# Patient Record
Sex: Female | Born: 1995 | Race: Black or African American | Hispanic: No | Marital: Single | Smoking: Never smoker
Health system: Southern US, Community
[De-identification: ages and names within clinical notes are randomized; demographics above are authoritative.]

## PROBLEM LIST (undated history)

## (undated) DIAGNOSIS — D821 Di George's syndrome: Secondary | ICD-10-CM

## (undated) HISTORY — PX: TONSILLECTOMY: SUR1361

---
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
## 2015-07-27 ENCOUNTER — Ambulatory Visit (INDEPENDENT_AMBULATORY_CARE_PROVIDER_SITE_OTHER): Payer: BC Managed Care – PPO | Admitting: Urgent Care

## 2015-07-27 VITALS — BP 112/66 | HR 88 | Temp 98.4°F | Resp 16 | Ht 65.0 in | Wt 208.0 lb

## 2015-07-27 DIAGNOSIS — H6123 Impacted cerumen, bilateral: Secondary | ICD-10-CM | POA: Diagnosis not present

## 2015-07-27 DIAGNOSIS — J029 Acute pharyngitis, unspecified: Secondary | ICD-10-CM | POA: Diagnosis not present

## 2015-07-27 MED ORDER — AMOXICILLIN 875 MG PO TABS: 875.0000 mg | ORAL_TABLET | Freq: Two times a day (BID) | ORAL | Status: DC

## 2015-07-27 NOTE — Patient Instructions (Signed)
Pharyngitis °Pharyngitis is redness, pain, and swelling (inflammation) of your pharynx.  °CAUSES  °Pharyngitis is usually caused by infection. Most of the time, these infections are from viruses (viral) and are part of a cold. However, sometimes pharyngitis is caused by bacteria (bacterial). Pharyngitis can also be caused by allergies. Viral pharyngitis may be spread from person to person by coughing, sneezing, and personal items or utensils (cups, forks, spoons, toothbrushes). Bacterial pharyngitis may be spread from person to person by more intimate contact, such as kissing.  °SIGNS AND SYMPTOMS  °Symptoms of pharyngitis include:   °· Sore throat.   °· Tiredness (fatigue).   °· Low-grade fever.   °· Headache. °· Joint pain and muscle aches. °· Skin rashes. °· Swollen lymph nodes. °· Plaque-like film on throat or tonsils (often seen with bacterial pharyngitis). °DIAGNOSIS  °Your health care provider will ask you questions about your illness and your symptoms. Your medical history, along with a physical exam, is often all that is needed to diagnose pharyngitis. Sometimes, a rapid strep test is done. Other lab tests may also be done, depending on the suspected cause.  °TREATMENT  °Viral pharyngitis will usually get better in 3-4 days without the use of medicine. Bacterial pharyngitis is treated with medicines that kill germs (antibiotics).  °HOME CARE INSTRUCTIONS  °· Drink enough water and fluids to keep your urine clear or pale yellow.   °· Only take over-the-counter or prescription medicines as directed by your health care provider:   °· If you are prescribed antibiotics, make sure you finish them even if you start to feel better.   °· Do not take aspirin.   °· Get lots of rest.   °· Gargle with 8 oz of salt water (½ tsp of salt per 1 qt of water) as often as every 1-2 hours to soothe your throat.   °· Throat lozenges (if you are not at risk for choking) or sprays may be used to soothe your throat. °SEEK MEDICAL  CARE IF:  °· You have large, tender lumps in your neck. °· You have a rash. °· You cough up green, yellow-brown, or bloody spit. °SEEK IMMEDIATE MEDICAL CARE IF:  °· Your neck becomes stiff. °· You drool or are unable to swallow liquids. °· You vomit or are unable to keep medicines or liquids down. °· You have severe pain that does not go away with the use of recommended medicines. °· You have trouble breathing (not caused by a stuffy nose). °MAKE SURE YOU:  °· Understand these instructions. °· Will watch your condition. °· Will get help right away if you are not doing well or get worse. °  °This information is not intended to replace advice given to you by your health care provider. Make sure you discuss any questions you have with your health care provider. °  °Document Released: 09/27/2005 Document Revised: 07/18/2013 Document Reviewed: 06/04/2013 °Elsevier Interactive Patient Education ©2016 Elsevier [employer] °Cerumen Impaction °The structures of the external ear canal secrete a waxy substance known as cerumen. Excess cerumen can build up in the ear canal, causing a condition known as cerumen impaction. Cerumen impaction can cause ear pain and disrupt the function of the ear. °The rate of cerumen production differs for each individual. In certain individuals, the configuration of the ear canal may decrease his or her ability to naturally remove cerumen. °CAUSES °Cerumen impaction is caused by excessive cerumen production or buildup. °RISK FACTORS °· Frequent use of swabs to clean ears. °· Having narrow ear canals. °· Having eczema. °· Being dehydrated. °  SIGNS AND SYMPTOMS °· Diminished hearing. °· Ear drainage. °· Ear pain. °· Ear itch. °TREATMENT °Treatment may involve: °· Over-the-counter or prescription ear drops to soften the cerumen. °· Removal of cerumen by a health care provider. This may be done with: °¨ Irrigation with warm water. This is the most common method of removal. °¨ Ear curettes and other  instruments. °¨ Surgery. This may be done in severe cases. °HOME CARE INSTRUCTIONS °· Take medicines only as directed by your health care provider. °· Do not insert objects into the ear with the intent of cleaning the ear. °PREVENTION °· Do not insert objects into the ear, even with the intent of cleaning the ear. Removing cerumen as a part of normal hygiene is not necessary, and the use of swabs in the ear canal is not recommended. °· Drink enough water to keep your urine clear or pale yellow. °· Control your eczema if you have it. °SEEK MEDICAL CARE IF: °· You develop ear pain. °· You develop bleeding from the ear. °· The cerumen does not clear after you use ear drops as directed. °  °This information is not intended to replace advice given to you by your health care provider. Make sure you discuss any questions you have with your health care provider. °  °Document Released: 11/04/2004 Document Revised: 10/18/2014 Document Reviewed: 05/14/2015 °Elsevier Interactive Patient Education ©2016 Elsevier Inc. ° °

## 2015-07-27 NOTE — Progress Notes (Signed)
    MRN: [id number] DOB: 09/27/1996  Subjective:   Kathryn Peters is a 19 y.o. female presenting for chief complaint of Sore Throat and Fatigue  Reports 2 week history of worsening sore throat, subjective fever, bilateral ear pain. Also has neck pain, productive cough, difficulty sleeping, fatigue, dizziness. Was seen by Dr. Parke SimmersBland on 07/25/2015 and started on Cipro for an ear infection. Denies chest pain, shob, wheezing, n/v, abdominal pain, sinus pain, sinus congestion, ear drainage, itchy or red eyes. Denies any other aggravating or relieving factors, no other questions or concerns.  Kathryn Peters currently has no medications in their medication list. Also is allergic to strawberry.  Kathryn Peters  has a past medical history of Di George's syndrome (HCC). Also  has past surgical history that includes Tonsillectomy.  Objective:   Vitals: BP 112/66 mmHg  Pulse 88  Temp(Src) 98.4 F (36.9 C) (Oral)  Resp 16  Ht 5\' 5"  (1.651 m)  Wt 208 lb (94.348 kg)  BMI 34.61 kg/m2  SpO2 98%  Physical Exam  Constitutional: She is oriented to person, place, and time. She appears well-developed and well-nourished.  HENT:  TM's cerumen occluded bilaterally. Nasal turbinates pink and moist, no sinus tenderness. No oropharyngeal exudates, erythema or abscesses.   Eyes: Right eye exhibits no discharge. Left eye exhibits no discharge. No scleral icterus.  Neck: Normal range of motion. Neck supple.  Cardiovascular: Normal rate, regular rhythm and intact distal pulses.  Exam reveals no gallop and no friction rub.   No murmur heard. Pulmonary/Chest: No respiratory distress. She has no wheezes. She has no rales.  Lymphadenopathy:    She has cervical adenopathy (right sided, anterior).  Neurological: She is alert and oriented to person, place, and time.  Skin: Skin is warm and dry. No rash noted. No erythema. No pallor.    Assessment and Plan :   1. Acute pharyngitis, unspecified etiology - Will cover for  infectious process. Counseled patient on differential, if patient develops rash while on amoxicillin, patient is to stop antibiotic and rtc for further testing including mono panel. Patient and father agreed.  2. Cerumen impaction, bilateral - Recommended patient stop antibiotic, significant improvement with ear irrigation. Counseled on otc remedies for this, rtc as needed for cerumen impaction.  Wallis BambergMario Prabhnoor Ellenberger, PA-C [facility] [phone] 07/27/2015 2:51 PM

## 2015-08-17 ENCOUNTER — Ambulatory Visit (INDEPENDENT_AMBULATORY_CARE_PROVIDER_SITE_OTHER): Payer: BC Managed Care – PPO | Admitting: Family Medicine

## 2015-08-17 VITALS — BP 124/80 | HR 87 | Temp 98.4°F | Resp 17 | Ht 67.5 in | Wt 202.0 lb

## 2015-08-17 DIAGNOSIS — L732 Hidradenitis suppurativa: Secondary | ICD-10-CM

## 2015-08-17 DIAGNOSIS — Z23 Encounter for immunization: Secondary | ICD-10-CM

## 2015-08-17 MED ORDER — DOXYCYCLINE HYCLATE 100 MG PO TABS: 100.0000 mg | ORAL_TABLET | Freq: Two times a day (BID) | ORAL | Status: DC

## 2015-08-17 NOTE — Progress Notes (Signed)
 @[facility] @  This chart was scribed for Elvina SidleKurt Lauenstein, MD by Andrew Auaven Small, ED Scribe. This patient was seen in room 10 and the patient's care was started at 1:01 PM.  Patient ID: Kathryn Peters MRN: [id number], DOB: 06/15/1996, 19 y.o. Date of Encounter: 08/17/2015, 1:01 PM  Primary Physician: Geraldo PitterBLAND,VEITA J, MD  Chief Complaint:   Chief Complaint  Patient presents with   boil in armpit right   Immunizations    flu shot     HPI: 19 y.o. year old female with history below presents with a worsening boil to right axilla. Pt has recurrent boils to both axillas every 6 months or so. Current boil has already drained but is still irritating. She has tried OTC cream for boils. No chance of pregnancy.   Pt would also like flu shot  Past Medical History  Diagnosis Date   Di George's syndrome [facility](HCC)      Home Meds: Prior to Admission medications   Medication Sig Start Date End Date Taking? Authorizing Provider  amoxicillin (AMOXIL) 875 MG tablet Take 1 tablet (875 mg total) by mouth 2 (two) times daily. Patient not taking: Reported on 08/17/2015 07/27/15   Wallis BambergMario Mani, PA-C    Allergies:  Allergies  Allergen Reactions   Strawberry Extract Itching    Social History   Social History   Marital Status: Single    Spouse Name: N/A   Number of Children: N/A   Years of Education: N/A   Occupational History   Not on file.   Social History Main Topics   Smoking status: Never Smoker    Smokeless tobacco: Not on file   Alcohol Use: No   Drug Use: No   Sexual Activity: Not on file   Other Topics Concern   Not on file   Social History Narrative     Review of Systems: Constitutional: negative for chills, fever, night sweats, weight changes, or fatigue  HEENT: negative for vision changes, hearing loss, congestion, rhinorrhea, ST, epistaxis, or sinus pressure Cardiovascular: negative for chest pain or palpitations Respiratory: negative for hemoptysis, wheezing,  shortness of breath, or cough Abdominal: negative for abdominal pain, nausea, vomiting, diarrhea, or constipation Dermatological: negative for rash Neurologic: negative for headache, dizziness, or syncope All other systems reviewed and are otherwise negative with the exception to those above and in the HPI.  Physical Exam: Blood pressure 124/80, pulse 87, temperature 98.4 F (36.9 C), temperature source Oral, resp. rate 17, height 5' 7.5" (1.715 m), weight 202 lb (91.627 kg), last menstrual period 07/17/2015, SpO2 98 %., Body mass index is 31.15 kg/(m^2). General: Well developed, well nourished, in no acute distress. Head: Normocephalic, atraumatic, eyes without discharge, sclera non-icteric, nares are without discharge. Bilateral auditory canals clear, TM's are without perforation, pearly grey and translucent with reflective cone of light bilaterally. Oral cavity moist, posterior pharynx without exudate, erythema, peritonsillar abscess, or post nasal drip.  Neck: Supple. No thyromegaly. Full ROM. No lymphadenopathy. Lungs: Clear bilaterally to auscultation without wheezes, rales, or rhonchi. Breathing is unlabored. Heart: RRR with S1 S2. No murmurs, rubs, or gallops appreciated. Abdomen: Soft, non-tender, non-distended with normoactive bowel sounds. No hepatomegaly. No rebound/guarding. No obvious abdominal masses. Msk:  Strength and tone normal for age. Extremities/Skin: Warm and dry. Patient has a 1 cm subcutaneous induration with overlying opening of a sebaceous cyst in the right axilla. This is tender with some surrounding erythema but no fluctuance Neuro: Alert and oriented X 3. Moves all extremities spontaneously. Gait is normal.  CNII-XII grossly in tact. Psych:  Responds to questions appropriately with a normal affect.    ASSESSMENT AND PLAN:  19 y.o. year old female with hidradenitis This chart was scribed in my presence and reviewed by me personally.    ICD-9-CM ICD-10-CM   1.  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 Need for prophylactic vaccination and inoculation against influenza V04.81 Z23 Flu Vaccine QUAD 36+ mos IM     CANCELED: Flu Vaccine QUAD 36+ mos IM      By signing my name below, I, Raven Small, attest that this documentation has been prepared under the direction and in the presence of Elvina Sidle, MD.  Electronically Signed: Andrew Au, ED Scribe. 08/17/2015. 1:08 PM.  Signed, Elvina Sidle, MD 08/17/2015 1:01 PM

## 2015-08-17 NOTE — Patient Instructions (Signed)

## 2015-09-09 ENCOUNTER — Emergency Department (HOSPITAL_COMMUNITY): Payer: BC Managed Care – PPO

## 2015-09-09 ENCOUNTER — Encounter (HOSPITAL_COMMUNITY): Payer: Self-pay | Admitting: Emergency Medicine

## 2015-09-09 ENCOUNTER — Emergency Department (HOSPITAL_COMMUNITY)
Admission: EM | Admit: 2015-09-09 | Discharge: 2015-09-09 | Disposition: A | Discharge status: Home / Self Care | Payer: BC Managed Care – PPO | Attending: Emergency Medicine | Admitting: Emergency Medicine

## 2015-09-09 DIAGNOSIS — Z79899 Other long term (current) drug therapy: Secondary | ICD-10-CM | POA: Diagnosis not present

## 2015-09-09 DIAGNOSIS — H5713 Ocular pain, bilateral: Secondary | ICD-10-CM | POA: Diagnosis not present

## 2015-09-09 DIAGNOSIS — R51 Headache: Secondary | ICD-10-CM | POA: Insufficient documentation

## 2015-09-09 DIAGNOSIS — Z3202 Encounter for pregnancy test, result negative: Secondary | ICD-10-CM | POA: Diagnosis not present

## 2015-09-09 DIAGNOSIS — R519 Headache, unspecified: Secondary | ICD-10-CM

## 2015-09-09 LAB — HCG, SERUM, QUALITATIVE: PREG SERUM: NEGATIVE

## 2015-09-09 LAB — CBC WITH DIFFERENTIAL/PLATELET
BASOS ABS: 0 10*3/uL (ref 0.0–0.1)
BASOS PCT: 0 %
Eosinophils Absolute: 0.2 10*3/uL (ref 0.0–0.7)
Eosinophils Relative: 2 %
HEMATOCRIT: 36.5 % (ref 36.0–46.0)
HEMOGLOBIN: 11.5 g/dL — AB (ref 12.0–15.0)
Lymphocytes Relative: 31 %
Lymphs Abs: 2.6 10*3/uL (ref 0.7–4.0)
MCH: 27.9 pg (ref 26.0–34.0)
MCHC: 31.5 g/dL (ref 30.0–36.0)
MCV: 88.6 fL (ref 78.0–100.0)
Monocytes Absolute: 0.4 10*3/uL (ref 0.1–1.0)
Monocytes Relative: 5 %
NEUTROS ABS: 5.1 10*3/uL (ref 1.7–7.7)
NEUTROS PCT: 62 %
Platelets: 185 10*3/uL (ref 150–400)
RBC: 4.12 MIL/uL (ref 3.87–5.11)
RDW: 13.6 % (ref 11.5–15.5)
WBC: 8.4 10*3/uL (ref 4.0–10.5)

## 2015-09-09 LAB — URINALYSIS, ROUTINE W REFLEX MICROSCOPIC
Bilirubin Urine: NEGATIVE
Glucose, UA: NEGATIVE mg/dL
Hgb urine dipstick: NEGATIVE
Ketones, ur: NEGATIVE mg/dL
LEUKOCYTES UA: NEGATIVE
NITRITE: NEGATIVE
Protein, ur: NEGATIVE mg/dL
SPECIFIC GRAVITY, URINE: 1.019 (ref 1.005–1.030)
pH: 6 (ref 5.0–8.0)

## 2015-09-09 LAB — COMPREHENSIVE METABOLIC PANEL
ALBUMIN: 3.9 g/dL (ref 3.5–5.0)
ALK PHOS: 90 U/L (ref 38–126)
ALT: 14 U/L (ref 14–54)
AST: 20 U/L (ref 15–41)
Anion gap: 7 (ref 5–15)
BILIRUBIN TOTAL: 0.4 mg/dL (ref 0.3–1.2)
BUN: 12 mg/dL (ref 6–20)
CALCIUM: 9 mg/dL (ref 8.9–10.3)
CO2: 25 mmol/L (ref 22–32)
Chloride: 107 mmol/L (ref 101–111)
Creatinine, Ser: 0.75 mg/dL (ref 0.44–1.00)
GFR calc Af Amer: >60 mL/min (ref >60)
GFR calc non Af Amer: >60 mL/min (ref >60)
GLUCOSE: 104 mg/dL — AB (ref 65–99)
POTASSIUM: 4.1 mmol/L (ref 3.5–5.1)
Sodium: 139 mmol/L (ref 135–145)
TOTAL PROTEIN: 7.8 g/dL (ref 6.5–8.1)

## 2015-09-09 MED ORDER — PROCHLORPERAZINE EDISYLATE 5 MG/ML IJ SOLN
10.0000 mg | Freq: Once | INTRAMUSCULAR | Status: AC
  Administered 2015-09-09: 10 mg via INTRAVENOUS
  Filled 2015-09-09: qty 2

## 2015-09-09 MED ORDER — DIPHENHYDRAMINE HCL 50 MG/ML IJ SOLN
25.0000 mg | Freq: Once | INTRAMUSCULAR | Status: AC
  Administered 2015-09-09: 25 mg via INTRAVENOUS
  Filled 2015-09-09: qty 1

## 2015-09-09 MED ORDER — KETOROLAC TROMETHAMINE 30 MG/ML IJ SOLN
30.0000 mg | Freq: Once | INTRAMUSCULAR | Status: AC
  Administered 2015-09-09: 30 mg via INTRAVENOUS
  Filled 2015-09-09: qty 1

## 2015-09-09 MED ORDER — SODIUM CHLORIDE 0.9 % IV BOLUS (SEPSIS)
1000.0000 mL | Freq: Once | INTRAVENOUS | Status: AC
  Administered 2015-09-09: 1000 mL via INTRAVENOUS

## 2015-09-09 NOTE — ED Notes (Signed)
Pt mom states they are here today because the neurological consult is not for another 3-4 weeks and want pt to be evaluated beforehand; pt has not gotten prescription from PCP filled yet

## 2015-09-09 NOTE — ED Notes (Signed)
Per mother/patient, states she had an "electrical shock" while she was in bed-something like a seizure-states eye pain and nausea-saw PCP yesterday and they referred her to Neuro

## 2015-09-09 NOTE — ED Provider Notes (Signed)
CSN: [id number]     Arrival date & time 09/09/15  4098 History   First MD Initiated Contact with Patient 09/09/15 0732     Chief Complaint  Patient presents with  . 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 History is provided from both the patient in the patient's father. Patient states that while going to bed on Sunday, she felt electrical sensation down her body, and she experienced a quick jerk of her extremities. This was followed by a headache, that has not gone away. She denies any loss of consciousness, tongue biting, urinary incontinence, double vision or vision changes, vomiting, fevers or chills. Has not had any numbness or weakness. Has not had any speech changes. Her family gave her a gram of Tylenol and Sunday, but states that it did not seem to help her. She states that she continues to have this headache since then. Headache described as bilateral and retro-orbital in nature. She has not had any recent illnesses. No fevers, chills, cough, congestion, sore throat, runny nose, diarrhea, abdominal pain, or urinary symptoms. Past Medical History  Diagnosis Date  . Di George's syndrome [facility])    Past Surgical History  Procedure Laterality Date  . Tonsillectomy     History reviewed. No pertinent family history. Social History  Substance Use Topics  . Smoking status: Never Smoker   . Smokeless tobacco: None  . Alcohol Use: No   OB History    No data available     Review of Systems 10/14 systems reviewed and are negative other than those stated in the HPI    Allergies  Strawberry extract  Home Medications   Prior to Admission medications   Medication Sig Start Date End Date Taking? Authorizing Provider  acetaminophen (TYLENOL) 500 MG tablet Take 1,000 mg by mouth every 6 (six) hours as needed for mild pain or  headache.   Yes Historical Provider, MD  doxycycline (VIBRA-TABS) 100 MG tablet Take 1 tablet (100 mg total) by mouth 2 (two) times daily. Patient taking differently: Take 100 mg by mouth 2 (two) times daily. Started 11/06 for 10 days 08/17/15  Yes Elvina Sidle, MD  Multiple Vitamin (MULTIVITAMIN WITH MINERALS) TABS tablet Take 1 tablet by mouth daily.   Yes Historical Provider, MD   BP 99/51 mmHg  Pulse 74  Temp(Src) 98.3 F (36.8 C) (Oral)  Resp 16  SpO2 95%  LMP 08/09/2015 Physical Exam Physical Exam  Nursing note and vitals reviewed. Constitutional: Well developed, well nourished, non-toxic, and in no acute distress Head: Normocephalic and atraumatic.  Mouth/Throat: Oropharynx is clear and moist.  Eyes: PERRL, EOMI. no pain with extraocular movements, normal conjunctiva no proptosis. Neck: Normal range of motion. Neck supple.  Cardiovascular: Normal rate and regular rhythm.   Pulmonary/Chest: Effort normal and breath sounds normal.  Abdominal: Soft. There is no tenderness. There is no rebound and no guarding.  Musculoskeletal: Normal range of motion.  Skin: Skin is warm and dry.  Psychiatric: Cooperative Neurological:  Alert, oriented to person, place, time, and situation. Memory grossly in tact. Fluent speech. No dysarthria or aphasia.  Cranial nerves: VF are full. Pupils are symmetric, and reactive to light. EOMI without nystagmus. No gaze deviation. Facial muscles symmetric with activation. Sensation to light touch over face in tact bilaterally. Hearing grossly in tact. Palate elevates symmetrically. Head turn and shoulder shrug are intact. Tongue midline.  Reflexes defered.  Muscle  bulk and tone normal. No pronator drift. Moves all extremities symmetrically. Sensation to light touch is in tact throughout in bilateral upper and lower extremities. Coordination reveals no dysmetria with finger to nose. Gait is narrow-based and steady. Non-ataxic. Normal tandem, heel, toe  walk.  ED Course  Procedures (including critical care time) Labs Review Labs Reviewed  CBC WITH DIFFERENTIAL/PLATELET - Abnormal; Notable for the following:    Hemoglobin 11.5 (*)    All other components within normal limits  COMPREHENSIVE METABOLIC PANEL - Abnormal; Notable for the following:    Glucose, Bld 104 (*)    All other components within normal limits  URINALYSIS, ROUTINE W REFLEX MICROSCOPIC (NOT AT [facility]) - Abnormal; Notable for the following:    APPearance CLOUDY (*)    All other components within normal limits  HCG, SERUM, QUALITATIVE  CALCIUM, IONIZED    Imaging Review Ct Head Wo Contrast  09/09/2015  CLINICAL DATA:  Headache for 2 days EXAM: CT HEAD WITHOUT CONTRAST TECHNIQUE: Contiguous axial images were obtained from the base of the skull through the vertex without intravenous contrast. COMPARISON:  December 10, 2013 FINDINGS: The ventricles are normal in size and configuration. There is no intracranial mass, hemorrhage, extra-axial fluid collection, or midline shift. Gray-white compartments are normal. No acute infarct evident. Bony calvarium appears intact. The visualized mastoids are clear. No intraorbital lesions are identified. IMPRESSION: Study within normal limits. Electronically Signed   By: Bretta BangWilliam  Woodruff III M.D.   On: 09/09/2015 09:35   I have personally reviewed and evaluated these images and lab results as part of my medical decision-making.   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 She does have prior history of headaches, but states that this is much more longer lasting than her typical headaches. Is in no acute distress, and well-appearing on presentation. Vital signs are non-concerning. Her neurological exam is unremarkable. Basic blood work reveals no major electrolyte or metabolic derangements. I do not suspect that she had  a seizure over the weekend, she had one episode of extremity jerking, while she was awake and trying to fall asleep. Given atypical nature of her headache, CT head was performed and is negative. She is given migraine cocktail, with improvement in her symptoms. She was encouraged to continue to follow-up with neurology as an outpatient. Strict return and follow-up instructions are reviewed with the patient and her parents. They express understanding of all discharge instructions and felt comfortable with the plan of care.  Lavera Guiseana Duo Lamonica Trueba, MD 09/09/15 [id number]

## 2015-09-09 NOTE — Discharge Instructions (Signed)
Please return without fail for worsening symptoms, including worsening pain, confusion, vomiting unable to keep down food or fluids, or any other symptoms concerning to you. I he can take ibuprofen or Motrin for headaches, but avoid taking them more than 2 or 3 days in a row as they can cause rebound headaches. Drink plenty of fluids and get plenty of rest.  Migraine Headache A migraine headache is an intense, throbbing pain on one or both sides of your head. A migraine can last for 30 minutes to several hours. CAUSES  The exact cause of a migraine headache is not always known. However, a migraine may be caused when nerves in the brain become irritated and release chemicals that cause inflammation. This causes pain. Certain things may also trigger migraines, such as:  Alcohol.  Smoking.  Stress.  Menstruation.  Aged cheeses.  Foods or drinks that contain nitrates, glutamate, aspartame, or tyramine.  Lack of sleep.  Chocolate.  Caffeine.  Hunger.  Physical exertion.  Fatigue.  Medicines used to treat chest pain (nitroglycerine), birth control pills, estrogen, and some blood pressure medicines. SIGNS AND SYMPTOMS  Pain on one or both sides of your head.  Pulsating or throbbing pain.  Severe pain that prevents daily activities.  Pain that is aggravated by any physical activity.  Nausea, vomiting, or both.  Dizziness.  Pain with exposure to bright lights, loud noises, or activity.  General sensitivity to bright lights, loud noises, or smells. Before you get a migraine, you may get warning signs that a migraine is coming (aura). An aura may include:  Seeing flashing lights.  Seeing bright spots, halos, or zigzag lines.  Having tunnel vision or blurred vision.  Having feelings of numbness or tingling.  Having trouble talking.  Having muscle weakness. DIAGNOSIS  A migraine headache is often diagnosed based on:  Symptoms.  Physical exam.  A CT scan or MRI  of your head. These imaging tests cannot diagnose migraines, but they can help rule out other causes of headaches. TREATMENT Medicines may be given for pain and nausea. Medicines can also be given to help prevent recurrent migraines.  HOME CARE INSTRUCTIONS  Only take over-the-counter or prescription medicines for pain or discomfort as directed by your health care provider. The use of long-term narcotics is not recommended.  Lie down in a dark, quiet room when you have a migraine.  Keep a journal to find out what may trigger your migraine headaches. For example, write down:  What you eat and drink.  How much sleep you get.  Any change to your diet or medicines.  Limit alcohol consumption.  Quit smoking if you smoke.  Get 7-9 hours of sleep, or as recommended by your health care provider.  Limit stress.  Keep lights dim if bright lights bother you and make your migraines worse. SEEK IMMEDIATE MEDICAL CARE IF:   Your migraine becomes severe.  You have a fever.  You have a stiff neck.  You have vision loss.  You have muscular weakness or loss of muscle control.  You start losing your balance or have trouble walking.  You feel faint or pass out.  You have severe symptoms that are different from your first symptoms. MAKE SURE YOU:   Understand these instructions.  Will watch your condition.  Will get help right away if you are not doing well or get worse.   This information is not intended to replace advice given to you by your health care provider. Make sure  you discuss any questions you have with your health care provider. °  °Document Released: 09/27/2005 Document Revised: 10/18/2014 Document Reviewed: 06/04/2013 °Elsevier Interactive Patient Education ©2016 [employer] ° °

## 2015-09-10 LAB — CALCIUM, IONIZED: Calcium, Ionized, Serum: 4.9 mg/dL (ref 4.5–5.6)

## 2015-09-17 ENCOUNTER — Emergency Department (HOSPITAL_COMMUNITY)
Admission: EM | Admit: 2015-09-17 | Discharge: 2015-09-17 | Disposition: A | Discharge status: Home / Self Care | Payer: BC Managed Care – PPO | Attending: Emergency Medicine | Admitting: Emergency Medicine

## 2015-09-17 ENCOUNTER — Encounter (HOSPITAL_COMMUNITY): Payer: Self-pay

## 2015-09-17 ENCOUNTER — Emergency Department (HOSPITAL_COMMUNITY): Payer: BC Managed Care – PPO

## 2015-09-17 DIAGNOSIS — S4992XA Unspecified injury of left shoulder and upper arm, initial encounter: Secondary | ICD-10-CM | POA: Insufficient documentation

## 2015-09-17 DIAGNOSIS — W07XXXA Fall from chair, initial encounter: Secondary | ICD-10-CM | POA: Diagnosis not present

## 2015-09-17 DIAGNOSIS — Y9389 Activity, other specified: Secondary | ICD-10-CM | POA: Insufficient documentation

## 2015-09-17 DIAGNOSIS — Y9289 Other specified places as the place of occurrence of the external cause: Secondary | ICD-10-CM | POA: Diagnosis not present

## 2015-09-17 DIAGNOSIS — Z3202 Encounter for pregnancy test, result negative: Secondary | ICD-10-CM | POA: Diagnosis not present

## 2015-09-17 DIAGNOSIS — S79912A Unspecified injury of left hip, initial encounter: Secondary | ICD-10-CM | POA: Diagnosis not present

## 2015-09-17 DIAGNOSIS — W19XXXA Unspecified fall, initial encounter: Secondary | ICD-10-CM

## 2015-09-17 DIAGNOSIS — M25512 Pain in left shoulder: Secondary | ICD-10-CM

## 2015-09-17 DIAGNOSIS — Y998 Other external cause status: Secondary | ICD-10-CM | POA: Diagnosis not present

## 2015-09-17 DIAGNOSIS — M25552 Pain in left hip: Secondary | ICD-10-CM

## 2015-09-17 DIAGNOSIS — Z862 Personal history of diseases of the blood and blood-forming organs and certain disorders involving the immune mechanism: Secondary | ICD-10-CM | POA: Insufficient documentation

## 2015-09-17 DIAGNOSIS — S3991XA Unspecified injury of abdomen, initial encounter: Secondary | ICD-10-CM | POA: Insufficient documentation

## 2015-09-17 LAB — COMPREHENSIVE METABOLIC PANEL
ALT: 24 U/L (ref 14–54)
ANION GAP: 8 (ref 5–15)
AST: 28 U/L (ref 15–41)
Albumin: 3.9 g/dL (ref 3.5–5.0)
Alkaline Phosphatase: 78 U/L (ref 38–126)
BILIRUBIN TOTAL: 0.2 mg/dL — AB (ref 0.3–1.2)
BUN: 12 mg/dL (ref 6–20)
CALCIUM: 9.1 mg/dL (ref 8.9–10.3)
CHLORIDE: 106 mmol/L (ref 101–111)
CO2: 24 mmol/L (ref 22–32)
CREATININE: 0.8 mg/dL (ref 0.44–1.00)
Glucose, Bld: 102 mg/dL — ABNORMAL HIGH (ref 65–99)
POTASSIUM: 3.8 mmol/L (ref 3.5–5.1)
Sodium: 138 mmol/L (ref 135–145)
TOTAL PROTEIN: 7.6 g/dL (ref 6.5–8.1)

## 2015-09-17 LAB — URINALYSIS, ROUTINE W REFLEX MICROSCOPIC
BILIRUBIN URINE: NEGATIVE
Glucose, UA: NEGATIVE mg/dL
HGB URINE DIPSTICK: NEGATIVE
Ketones, ur: NEGATIVE mg/dL
Leukocytes, UA: NEGATIVE
Nitrite: NEGATIVE
PROTEIN: NEGATIVE mg/dL
Specific Gravity, Urine: 1.035 — ABNORMAL HIGH (ref 1.005–1.030)
pH: 6 (ref 5.0–8.0)

## 2015-09-17 LAB — CBC
HCT: 36.9 % (ref 36.0–46.0)
Hemoglobin: 11.8 g/dL — ABNORMAL LOW (ref 12.0–15.0)
MCH: 28.3 pg (ref 26.0–34.0)
MCHC: 32 g/dL (ref 30.0–36.0)
MCV: 88.5 fL (ref 78.0–100.0)
PLATELETS: 189 10*3/uL (ref 150–400)
RBC: 4.17 MIL/uL (ref 3.87–5.11)
RDW: 13.8 % (ref 11.5–15.5)
WBC: 9.5 10*3/uL (ref 4.0–10.5)

## 2015-09-17 LAB — POC URINE PREG, ED: PREG TEST UR: NEGATIVE

## 2015-09-17 LAB — LIPASE, BLOOD: LIPASE: 29 U/L (ref 11–51)

## 2015-09-17 MED ORDER — TRAMADOL HCL 50 MG PO TABS: 50.0000 mg | ORAL_TABLET | Freq: Four times a day (QID) | ORAL | Status: DC | PRN

## 2015-09-17 NOTE — ED Notes (Addendum)
Pt fell on her side while putting up Christmas decorations on Monday night. Woke up tonight crying in pain per mom. Complaining of Left shoulder pain raidating to her hand. Also endorses abdominal pain and hip pain.10/10. Denies N/V/D

## 2015-09-17 NOTE — Discharge Instructions (Signed)
Take the prescribed medication as directed.  You may take this with tylenol or motrin if needed.  You may continue to be sore for the next few days which is normal. Follow-up with your primary care physician. Return to the ED for new or worsening symptoms.

## 2015-09-17 NOTE — ED Provider Notes (Signed)
CSN: [id number]     Arrival date & time 09/17/15  0505 History   First MD Initiated Contact with Patient 09/17/15 [phone]     Chief Complaint  Patient presents with  . Fall  . Abdominal Pain     (Consider location/radiation/quality/duration/timing/severity/associated sxs/prior Treatment) The history is provided by the patient and medical records.    19 year old female with hx of Judi Cong Geroge's syndrome, presenting to the ED after a fall on Monday night. Patient states she was standing on a chair decorating the Christmas tree. She states she went to step down off the chair with leg leg but once her foot touched the ground she lost her footing and fell to the ground. She landed on her left hip and left shoulder.  Patient denies any head injury or loss of consciousness. Patient states she is continuing to have left shoulder pain and left hip pain. She states she feels she is developing some left lower abdominal pain as well. States she feels the pain in her abdomen mostly with movement of left leg or coughing.  Patient has been eating and drinking normally. She denies any nausea, vomiting, or diarrhea. No melena or hematochezia. Patient states she's been taking Motrin for pain without relief.  Patient is not currently on any type of anti-coagulation.  No headache, dizziness, confusion.  VSS.  Past Medical History  Diagnosis Date  . Di George's syndrome [facility])    Past Surgical History  Procedure Laterality Date  . Tonsillectomy     History reviewed. No pertinent family history. Social History  Substance Use Topics  . Smoking status: Never Smoker   . Smokeless tobacco: None  . Alcohol Use: No   OB History    No data available     Review of Systems  Gastrointestinal: Positive for abdominal pain.  Musculoskeletal: Positive for arthralgias.  All other systems reviewed and are negative.     Allergies  Strawberry extract  Home Medications   Prior to Admission medications   Medication  Sig Start Date End Date Taking? Authorizing Provider  acetaminophen (TYLENOL) 500 MG tablet Take 1,000 mg by mouth every 6 (six) hours as needed for mild pain or headache.   Yes Historical Provider, MD  ibuprofen (ADVIL,MOTRIN) 200 MG tablet Take 400 mg by mouth every 6 (six) hours as needed for moderate pain.   Yes Historical Provider, MD  doxycycline (VIBRA-TABS) 100 MG tablet Take 1 tablet (100 mg total) by mouth 2 (two) times daily. Patient not taking: Reported on 09/17/2015 08/17/15   Elvina Sidle, MD   BP 119/101 mmHg  Pulse 91  Temp(Src) 98.1 F (36.7 C) (Oral)  Resp 18  Ht  (1.651 m)  Wt 90.719 kg  BMI 33.28 kg/m2  SpO2 100%  LMP 08/25/2015 (Approximate)   Physical Exam  Constitutional: She is oriented to person, place, and time. She appears well-developed and well-nourished.  HENT:  Head: Normocephalic and atraumatic.  Mouth/Throat: Oropharynx is clear and moist.  No visible signs of head trauma  Eyes: Conjunctivae and EOM are normal. Pupils are equal, round, and reactive to light.  Neck: Normal range of motion.  Cardiovascular: Normal rate, regular rhythm and normal heart sounds.   Pulmonary/Chest: Effort normal and breath sounds normal.  Abdominal: Soft. Bowel sounds are normal. There is tenderness. There is no rigidity, no guarding and no CVA tenderness.    Mild tenderness of left lower abdomen surrounding left hip as depicted, no bruising or deformity noted; normal bowel sounds; no  distention; pain increases with movement of left leg  Musculoskeletal: Normal range of motion.       Left hip: She exhibits tenderness and bony tenderness.       Legs: Left shoulder grossly normal in appearance without bruising, swelling, deformity, or focal tenderness; endorses pain with movement of left shoulder; normal grip strength; normal sensation throughout arm; strong radial pulse and cap refill Left hip with some bony tenderness noted along lateral aspect; no deformities; some  pain with full flexion of hip noted; no pain with extension; leg is NVI; compartments soft  Neurological: She is alert and oriented to person, place, and time.  Skin: Skin is warm and dry.  Psychiatric: She has a normal mood and affect.  Nursing note and vitals reviewed.   ED Course  Procedures (including critical care time) Labs Review Labs Reviewed  COMPREHENSIVE METABOLIC PANEL - Abnormal; Notable for the following:    Glucose, Bld 102 (*)    Total Bilirubin 0.2 (*)    All other components within normal limits  CBC - Abnormal; Notable for the following:    Hemoglobin 11.8 (*)    All other components within normal limits  URINALYSIS, ROUTINE W REFLEX MICROSCOPIC (NOT AT [facility]) - Abnormal; Notable for the following:    APPearance CLOUDY (*)    Specific Gravity, Urine 1.035 (*)    All other components within normal limits  LIPASE, BLOOD  POC URINE PREG, ED    Imaging Review Dg Shoulder Left  09/17/2015  CLINICAL DATA:  Initial evaluation for acute trauma, fall. EXAM: LEFT SHOULDER - 2+ VIEW COMPARISON:  None. FINDINGS: There is no evidence of fracture or dislocation. There is no evidence of arthropathy or other focal bone abnormality. Soft tissues are unremarkable. IMPRESSION: Negative. Electronically Signed   By: Rise MuBenjamin  McClintock M.D.   On: 09/17/2015 06:42   Dg Hip Unilat With Pelvis 2-3 Views Left  09/17/2015  CLINICAL DATA:  19 year old female with fall and left hip pain EXAM: DG HIP (WITH OR WITHOUT PELVIS) 2-3V LEFT COMPARISON:  None. FINDINGS: There is no evidence of hip fracture or dislocation. There is no evidence of arthropathy or other focal bone abnormality. IMPRESSION: Negative. Electronically Signed   By: Elgie CollardArash  Radparvar M.D.   On: 09/17/2015 06:42   I have personally reviewed and evaluated these images and lab results as part of my medical decision-making.   EKG Interpretation None      MDM   Final diagnoses:  Fall, initial encounter  Left hip pain    Left shoulder pain   19 year old female here after a fall from standing position 2 days ago while stepping down off a chair.  Left foot was on the ground when she lost her footing and fell onto left hip and left shoulder. No head injury or loss of consciousness. Patient is afebrile, nontoxic. She does have some tenderness of her left lower abdomen surrounding hip, worse with movement of left leg.  No bruising or deformities noted.  Left shoulder is overall nontender, endorses pain with movement.  Lab work was obtained which is reassuring-- no leukocytosis, H/H stable.  Given that there is no bruising of abdomen, no drop in hemoglobin, patient is tolerating PO well, VS are stable and left abd pain is worse with movement of left leg, abd pain is likely radiating from her left hip.  Lower suspicion for acute intestinal hemorrhage or other injuries at this time.  Will start on tramadol for pain, may continue tylenol/motrin if  needed as well.  Follow-up with PCP later this week is still having issues.  Discussed plan with patient and dad, they acknowledged understanding and agreed with plan of care.  Return precautions given for new or worsening symptoms.  Garlon Hatchet, PA-C 09/17/15 0745  Garlon Hatchet, PA-C 09/17/15 0746  Layla Maw Ward, DO 09/17/15 [phone]

## 2015-11-09 ENCOUNTER — Ambulatory Visit (INDEPENDENT_AMBULATORY_CARE_PROVIDER_SITE_OTHER): Payer: Managed Care, Other (non HMO)

## 2015-11-09 ENCOUNTER — Ambulatory Visit (INDEPENDENT_AMBULATORY_CARE_PROVIDER_SITE_OTHER): Payer: Managed Care, Other (non HMO) | Admitting: Urgent Care

## 2015-11-09 VITALS — BP 110/70 | HR 80 | Temp 98.3°F | Resp 16 | Ht 65.0 in | Wt 208.0 lb

## 2015-11-09 DIAGNOSIS — D821 Di George's syndrome: Secondary | ICD-10-CM | POA: Diagnosis not present

## 2015-11-09 DIAGNOSIS — M549 Dorsalgia, unspecified: Secondary | ICD-10-CM

## 2015-11-09 DIAGNOSIS — M62838 Other muscle spasm: Secondary | ICD-10-CM

## 2015-11-09 DIAGNOSIS — W19XXXA Unspecified fall, initial encounter: Secondary | ICD-10-CM | POA: Diagnosis not present

## 2015-11-09 DIAGNOSIS — M546 Pain in thoracic spine: Secondary | ICD-10-CM | POA: Diagnosis not present

## 2015-11-09 DIAGNOSIS — M6248 Contracture of muscle, other site: Secondary | ICD-10-CM

## 2015-11-09 DIAGNOSIS — M542 Cervicalgia: Secondary | ICD-10-CM | POA: Diagnosis not present

## 2015-11-09 MED ORDER — CYCLOBENZAPRINE HCL 5 MG PO TABS: 5.0000 mg | ORAL_TABLET | Freq: Every day | ORAL | Status: DC

## 2015-11-09 MED ORDER — NAPROXEN SODIUM 550 MG PO TABS: 550.0000 mg | ORAL_TABLET | Freq: Two times a day (BID) | ORAL | Status: DC

## 2015-11-09 NOTE — Progress Notes (Addendum)
MRN: [id number] DOB: 1996/06/24  Subjective:   Kathryn Peters is a 20 y.o. female presenting for chief complaint of Back Pain  Reports ~1 month history of upper back pain s/p fall from putting up Christmas decorations. Patient was standing on chair and lost balance, fell directly onto her back. Her back pain feels sharp, constant, wakes patient up from her sleep. Has been seen by her PCP, Dr. Parke Simmers and was told to take ibuprofen. Patient also has intermittent neck pain, stiffness. Has tried 2-3 ibuprofen daily for her back pain with some relief. Denies fever, loss of consciousness, bony deformity, numbness or tingling, weakness, arm pain, incontinence. Denies history of back injuries or surgeries. Denies smoking cigarettes.  Kathryn Peters has a current medication list which includes the following prescription(s): ibuprofen, acetaminophen, and tramadol. Also is allergic to strawberry extract.  Kathryn Peters  has a past medical history of Di George's syndrome (HCC). Also  has past surgical history that includes Tonsillectomy.  Objective:   Vitals: BP 110/70 mmHg  Pulse 80  Temp(Src) 98.3 F (36.8 C)  Resp 16  Ht  (1.651 m)  Wt 208 lb (94.348 kg)  BMI 34.61 kg/m2  SpO2 98%  LMP 10/19/2015 (Approximate)  Physical Exam  Constitutional: She is oriented to person, place, and time. She appears well-developed and well-nourished.  Neck: Normal range of motion. Neck supple.  Cardiovascular: Normal rate, regular rhythm and intact distal pulses.  Exam reveals no gallop and no friction rub.   No murmur heard. Pulmonary/Chest: No respiratory distress. She has no wheezes. She has no rales.  Musculoskeletal:       Cervical back: She exhibits decreased range of motion (extension) and spasm (multiple over areas depicted). She exhibits no tenderness, no bony tenderness, no swelling, no edema, no deformity and no laceration.       Thoracic back: She exhibits normal range of motion, no tenderness, no  bony tenderness, no swelling, no edema, no deformity, no laceration and no spasm.       Back:  Neurological: She is alert and oriented to person, place, and time.   Assessment and Plan :   1. Fall, initial encounter 2. Neck pain 3. Trapezius muscle spasm 4. Upper back pain on left side - Radiology over-read pending. Physical exam findings reassuring, will continue with conservative management with Flexeril and Anaprox. RTC in 1 week if no improvement.  5. DiGeorge syndrome (HCC) - Stable  Wallis Bamberg, PA-C [facility] [phone] 11/09/2015 4:33 PM   addnd J [place] Received a call from Dr. Molli Posey at approx 6pm- the pt may have a wedge fracture of her C6 as below. A CT is recommended.   Called and discussed with both her mother Kathryn Peters and her father Kathryn Peters- they are divorced and have separate households.  Will plan for a CT tomorrow.  Certainly they may take her to the ER if desired, but as the injury was a month ago would also be reasonable to avoid an ER visit and do CT tomorrow.  Mother would like to do CT tomorrow and I will arrange first thing  CERVICAL SPINE - COMPLETE 4+ VIEW  COMPARISON: None.  FINDINGS: Five view exam shows anterior wedge deformity of the C6 vertebral body resulting in approximately 25% loss of height anteriorly. No fracture line can be identified on the current study and no loose bony fragments are evident. However the deformity results in focal kyphosis at the C6 level. Some  widening of the C6-7 facets is suspected. No prevertebral soft tissue swelling.  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 Although no fracture line can be identified by x-ray, consider CT imaging to further evaluate as C6 fracture could have this appearance.

## 2015-11-09 NOTE — Patient Instructions (Addendum)
Back Pain, Adult °Back pain is very common in adults. The cause of back pain is rarely dangerous and the pain often gets better over time. The cause of your back pain may not be known. Some common causes of back pain include: °· Strain of the muscles or ligaments supporting the spine. °· Wear and tear (degeneration) of the spinal disks. °· Arthritis. °· Direct injury to the back. °For many people, back pain may return. Since back pain is rarely dangerous, most people can learn to manage this condition on their own. °HOME CARE INSTRUCTIONS °Watch your back pain for any changes. The following actions may help to lessen any discomfort you are feeling: °· Remain active. It is stressful on your back to sit or stand in one place for long periods of time. Do not sit, drive, or stand in one place for more than 30 minutes at a time. Take short walks on even surfaces as soon as you are able. Try to increase the length of time you walk each day. °· Exercise regularly as directed by your health care provider. Exercise helps your back heal faster. It also helps avoid future injury by keeping your muscles strong and flexible. °· Do not stay in bed. Resting more than 1-2 days can delay your recovery. °· Pay attention to your body when you bend and lift. The most comfortable positions are those that put less stress on your recovering back. Always use proper lifting techniques, including: °· Bending your knees. °· Keeping the load close to your body. °· Avoiding twisting. °· Find a comfortable position to sleep. Use a firm mattress and lie on your side with your knees slightly bent. If you lie on your back, put a pillow under your knees. °· Avoid feeling anxious or stressed. Stress increases muscle tension and can worsen back pain. It is important to recognize when you are anxious or stressed and learn ways to manage it, such as with exercise. °· Take medicines only as directed by your health care provider. Over-the-counter  medicines to reduce pain and inflammation are often the most helpful. Your health care provider may prescribe muscle relaxant drugs. These medicines help dull your pain so you can more quickly return to your normal activities and healthy exercise. °· Apply ice to the injured area: °· Put ice in a plastic bag. °· Place a towel between your skin and the bag. °· Leave the ice on for 20 minutes, 2-3 times a day for the first 2-3 days. After that, ice and heat may be alternated to reduce pain and spasms. °· Maintain a healthy weight. Excess weight puts extra stress on your back and makes it difficult to maintain good posture. °SEEK MEDICAL CARE IF: °· You have pain that is not relieved with rest or medicine. °· You have increasing pain going down into the legs or buttocks. °· You have pain that does not improve in one week. °· You have night pain. °· You lose weight. °· You have a fever or chills. °SEEK IMMEDIATE MEDICAL CARE IF:  °· You develop new bowel or bladder control problems. °· You have unusual weakness or numbness in your arms or legs. °· You develop nausea or vomiting. °· You develop abdominal pain. °· You feel faint. °  °This information is not intended to replace advice given to you by your health care provider. Make sure you discuss any questions you have with your health care provider. °  °Document Released: 09/27/2005 Document Revised: 10/18/2014 Document Reviewed: 01/29/2014 °Elsevier Interactive Patient Education ©2016 Elsevier   Inc. Because you received an x-ray today, you will receive an invoice from [facility]. Please contact [facility] at [phone] with questions or concerns regarding your invoice. Our billing staff will not be able to assist you with those questions.

## 2015-11-10 ENCOUNTER — Ambulatory Visit
Admission: RE | Admit: 2015-11-10 | Discharge: 2015-11-10 | Disposition: A | Discharge status: Home / Self Care | Payer: Managed Care, Other (non HMO) | Source: Ambulatory Visit | Attending: Urgent Care | Admitting: Urgent Care

## 2015-11-10 ENCOUNTER — Telehealth: Payer: Self-pay

## 2015-11-10 ENCOUNTER — Telehealth: Payer: Self-pay | Admitting: Urgent Care

## 2015-11-10 ENCOUNTER — Telehealth: Payer: Self-pay | Admitting: Family Medicine

## 2015-11-10 ENCOUNTER — Encounter: Payer: Self-pay | Admitting: Urgent Care

## 2015-11-10 ENCOUNTER — Other Ambulatory Visit: Payer: Self-pay | Admitting: Urgent Care

## 2015-11-10 DIAGNOSIS — S199XXA Unspecified injury of neck, initial encounter: Secondary | ICD-10-CM

## 2015-11-10 NOTE — Telephone Encounter (Signed)
Patients father called in alittle upset asking that we not call his daughter anymore and give her any results because she doesn't understand what is going on and he wants [place] to only speak to him. I informed him that since she is over the age of 35 then she would need to call in an explain to [place] if she wants [place] to only speak to him about her situation because she is of legal age and we can not take his word for it. He stated he understood and would have her call us.

## 2015-11-10 NOTE — Progress Notes (Signed)
Patient was seen yesterday, x-ray over-read was called to Dr. Patsy Lager. Possible cervical disc fracture at C6. Recommendation was to r/o cervical disc fracture with neck CT.

## 2015-11-10 NOTE — Telephone Encounter (Signed)
Communicated results to patient and her mother. Kathryn Peters does not have a neck fracture from her fall ~1 month ago. However, Kathryn Peters does have degenerative disc disease and spinal stenosis. 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 I counseled her mother and reassured her of the findings below. Kathryn Peters requested that I fax results to Dr. Dominique Hatcher ([phone]) and forward them to Dr. Bland.  Dg Cervical Spine Complete  11/09/2015  ADDENDUM REPORT: 11/09/2015 18:15 ADDENDUM: I discussed the results of this study with Dr. Copeland, on call for urgent medical and family Care , at approximately 1800 hours on 11/09/2015. Electronically Signed   By: Eric  Mansell M.D.   On: 11/09/2015 18:15  11/09/2015  CLINICAL DATA:  Neck pain persistent for 1 month after fall directly on her back. EXAM: CERVICAL SPINE - COMPLETE 4+ VIEW COMPARISON:  None. FINDINGS: Five view exam shows anterior wedge deformity of the C6 vertebral body resulting in approximately 25% loss of height anteriorly. No fracture line can be identified on the current study and no loose bony fragments are evident. However the deformity results in focal kyphosis at the C6 level. Some widening of the C6-7 facets is suspected. No prevertebral soft tissue swelling. 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 Although no fracture line can be identified by x-ray, consider CT imaging to further evaluate as C6 fracture could have this appearance. Electronically Signed: By: Eric  Mansell M.D. On: 11/09/2015 17:22   Dg Thoracic Spine 2 View  11/09/2015  CLINICAL DATA:  Back pain. EXAM: THORACIC SPINE 2 VIEWS COMPARISON:  None. FINDINGS: There is no evidence of thoracic spine fracture. Alignment is normal. No other significant bone abnormalities are identified. IMPRESSION: Negative. Electronically Signed   By: Eric  Mansell M.D.   On: 11/09/2015 17:27   Ct Cervical Spine Wo Contrast  11/10/2015   CLINICAL DATA:  Fell from chair 10/07/2016. Neck pain. Abnormal cervical spine x-ray 11/09/2015 EXAM: CT CERVICAL SPINE WITHOUT CONTRAST TECHNIQUE: Multidetector CT imaging of the cervical spine was performed without intravenous contrast. Multiplanar CT image reconstructions were also generated. COMPARISON:  Cervical spine x-rays 11/09/2015 FINDINGS: Chronic kyphosis at C5-6 similar to the recent x-rays. There is mild anterior wedging of C6 without acute fracture. Normal alignment. No fracture or mass lesion identified. C2-3:  Negative C3-4:  Negative C4-5:  Negative C5-6: Right paracentral disc protrusion and uncinate spurring causing mild spinal stenosis C6-7: Central disc protrusion and associated uncinate spurring causing moderate spinal stenosis. Mild foraminal narrowing bilaterally. Anterior slip of the C6 on C7 facet joint due to kyphosis. No fracture identified C7-T1:  Negative Hypoplastic right lobe of the thyroid. Left lobe of thyroid is mildly enlarged and heterogeneous without mass lesion. 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 Mild spinal stenosis C5-6 and moderate stenosis at C6-7. Mild anterior wedging of C6 is not associated with fracture and may be related to kyphosis and degenerative change Electronically Signed   By: Charles  Clark M.D.   On: 11/10/2015 14:31   

## 2015-11-10 NOTE — Telephone Encounter (Signed)
I discussed results at length with the patient's father. He states that he and his ex-wife are not on speaking terms. I also discussed results at length the patient and her mother (at the patient's request). 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 I spent an inordinate amount of time speaking to both parents, not to mention that CMA Apolonio Schneiders also reviewed results with the father. I have already forwarded results to Dr. Parke Simmers and have requested results be faxed to Dr. Ninetta Lights. It is unfortunate that the parents are not on speaking terms but it is inappropriate to ask [place] to spend large amounts of time on the phone to explain results to each of them separately when we have patients to take care of in clinic.   If parents call again, I recommend that they refer back to the mailed results or check with the patient's PCP.

## 2015-11-10 NOTE — Telephone Encounter (Signed)
Gave referrals

## 2015-11-10 NOTE — Telephone Encounter (Signed)
Gave referrals number. Will call pt father with appt time

## 2015-11-10 NOTE — Telephone Encounter (Signed)
Kathryn Peters states his daughter had an xray done and was told someone was going to look at it and give them a call, would like to know what the results were. Please call [phone]

## 2015-11-10 NOTE — Telephone Encounter (Signed)
CT of her neck was denied by her insurance company. Staff spent approx 1 hour on the phone and then I did a peer to peer and scan was approved, approval number [id number]

## 2015-11-10 NOTE — Telephone Encounter (Signed)
Communicated results to patient and her mother. She does not have a neck fracture from her fall ~1 month ago. However, she does have degenerative disc disease and spinal stenosis. 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 I counseled her mother and reassured her of the findings below. She requested that I fax results to Dr. Mayford Knife [phone]) and forward them to Dr. Parke Simmers.  Dg Cervical Spine Complete  11/09/2015  ADDENDUM REPORT: 11/09/2015 18:15 ADDENDUM: I discussed the results of this study with Dr. Dallas Schimke, on call for urgent medical and family Care , at approximately 1800 hours on 11/09/2015. Electronically Signed   By: Kennith Center M.D.   On: 11/09/2015 18:15  11/09/2015  CLINICAL DATA:  Neck pain persistent for 1 month after fall directly on her back. EXAM: CERVICAL SPINE - COMPLETE 4+ VIEW COMPARISON:  None. FINDINGS: Five view exam shows anterior wedge deformity of the C6 vertebral body resulting in approximately 25% loss of height anteriorly. No fracture line can be identified on the current study and no loose bony fragments are evident. However the deformity results in focal kyphosis at the C6 level. Some widening of the C6-7 facets is suspected. No prevertebral soft tissue swelling. 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 Although no fracture line can be identified by x-ray, consider CT imaging to further evaluate as C6 fracture could have this appearance. Electronically Signed: By: Kennith Center M.D. On: 11/09/2015 17:22   Dg Thoracic Spine 2 View  11/09/2015  CLINICAL DATA:  Back pain. EXAM: THORACIC SPINE 2 VIEWS COMPARISON:  None. FINDINGS: There is no evidence of thoracic spine fracture. Alignment is normal. No other significant bone abnormalities are identified. IMPRESSION: Negative. Electronically Signed   By: Kennith Center M.D.   On: 11/09/2015 17:27   Ct Cervical Spine Wo Contrast  11/10/2015   CLINICAL DATA:  Larey Seat from chair 10/07/2016. Neck pain. Abnormal cervical spine x-ray 11/09/2015 EXAM: CT CERVICAL SPINE WITHOUT CONTRAST TECHNIQUE: Multidetector CT imaging of the cervical spine was performed without intravenous contrast. Multiplanar CT image reconstructions were also generated. COMPARISON:  Cervical spine x-rays 11/09/2015 FINDINGS: Chronic kyphosis at C5-6 similar to the recent x-rays. There is mild anterior wedging of C6 without acute fracture. Normal alignment. No fracture or mass lesion identified. C2-3:  Negative C3-4:  Negative C4-5:  Negative C5-6: Right paracentral disc protrusion and uncinate spurring causing mild spinal stenosis C6-7: Central disc protrusion and associated uncinate spurring causing moderate spinal stenosis. Mild foraminal narrowing bilaterally. Anterior slip of the C6 on C7 facet joint due to kyphosis. No fracture identified C7-T1:  Negative Hypoplastic right lobe of the thyroid. Left lobe of thyroid is mildly enlarged and heterogeneous without mass lesion. 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 Mild spinal stenosis C5-6 and moderate stenosis at C6-7. Mild anterior wedging of C6 is not associated with fracture and may be related to kyphosis and degenerative change Electronically Signed   By: Marlan Palau M.D.   On: 11/10/2015 14:31

## 2015-11-14 ENCOUNTER — Telehealth: Payer: Self-pay

## 2015-11-14 NOTE — Telephone Encounter (Signed)
Mother is following up on the faxes that were suppose to be sent from 11/10/15. She said she called Wednesday to see if they have received those faxes and they havent.  Please advise the mother  [phone]

## 2015-11-14 NOTE — Telephone Encounter (Signed)
Nurse from Dr. Parke Simmers office called today wanting to know if we can fax over the xray report from when she saw Kathryn Peters, I see a message where Kathryn Peters spoke with mom and said he would have them faxed over so I faxed them

## 2015-11-19 NOTE — Telephone Encounter (Signed)
Regard message attached: Looks like [name] faxed over reports on 2/3.  [name], RMA at 11/14/2015 2:33 PM     Status: Signed       Expand All Collapse All   Nurse from Dr. Parke Simmers office called today wanting to know if we can fax over the xray report from when she saw Kathryn Peters, I see a message where Kathryn Peters spoke with mom and said he would have them faxed over so I faxed them

## 2015-11-21 ENCOUNTER — Emergency Department (HOSPITAL_COMMUNITY)
Admission: EM | Admit: 2015-11-21 | Discharge: 2015-11-22 | Disposition: A | Discharge status: Home / Self Care | Payer: Managed Care, Other (non HMO) | Attending: Emergency Medicine | Admitting: Emergency Medicine

## 2015-11-21 ENCOUNTER — Encounter (HOSPITAL_COMMUNITY): Payer: Self-pay

## 2015-11-21 DIAGNOSIS — L732 Hidradenitis suppurativa: Secondary | ICD-10-CM | POA: Diagnosis not present

## 2015-11-21 DIAGNOSIS — M5412 Radiculopathy, cervical region: Secondary | ICD-10-CM | POA: Diagnosis not present

## 2015-11-21 DIAGNOSIS — Z3202 Encounter for pregnancy test, result negative: Secondary | ICD-10-CM | POA: Diagnosis not present

## 2015-11-21 DIAGNOSIS — Z862 Personal history of diseases of the blood and blood-forming organs and certain disorders involving the immune mechanism: Secondary | ICD-10-CM | POA: Insufficient documentation

## 2015-11-21 DIAGNOSIS — G8929 Other chronic pain: Secondary | ICD-10-CM | POA: Diagnosis not present

## 2015-11-21 DIAGNOSIS — Z79899 Other long term (current) drug therapy: Secondary | ICD-10-CM | POA: Diagnosis not present

## 2015-11-21 DIAGNOSIS — Z791 Long term (current) use of non-steroidal anti-inflammatories (NSAID): Secondary | ICD-10-CM | POA: Diagnosis not present

## 2015-11-21 DIAGNOSIS — Z792 Long term (current) use of antibiotics: Secondary | ICD-10-CM | POA: Insufficient documentation

## 2015-11-21 DIAGNOSIS — R05 Cough: Secondary | ICD-10-CM | POA: Insufficient documentation

## 2015-11-21 DIAGNOSIS — M542 Cervicalgia: Secondary | ICD-10-CM | POA: Diagnosis present

## 2015-11-21 DIAGNOSIS — R509 Fever, unspecified: Secondary | ICD-10-CM | POA: Insufficient documentation

## 2015-11-21 NOTE — ED Notes (Signed)
Patient c/o cervical pain with pain radiating into both arms with numbness. Patient was seen at [facility]. 30th. No fracture was seen at that time.

## 2015-11-21 NOTE — ED Notes (Signed)
Bed: WTR7 Expected date:  Expected time:  Means of arrival:  Comments: Phlebotomy 

## 2015-11-22 ENCOUNTER — Emergency Department (HOSPITAL_COMMUNITY): Payer: Managed Care, Other (non HMO)

## 2015-11-22 LAB — URINALYSIS, ROUTINE W REFLEX MICROSCOPIC
Bilirubin Urine: NEGATIVE
Glucose, UA: NEGATIVE mg/dL
Hgb urine dipstick: NEGATIVE
Ketones, ur: 15 mg/dL — AB
LEUKOCYTES UA: NEGATIVE
NITRITE: NEGATIVE
PH: 5.5 (ref 5.0–8.0)
Protein, ur: NEGATIVE mg/dL
SPECIFIC GRAVITY, URINE: 1.015 (ref 1.005–1.030)

## 2015-11-22 LAB — PREGNANCY, URINE: PREG TEST UR: NEGATIVE

## 2015-11-22 MED ORDER — SULFAMETHOXAZOLE-TRIMETHOPRIM 800-160 MG PO TABS: 1.0000 | ORAL_TABLET | Freq: Two times a day (BID) | ORAL | Status: AC

## 2015-11-22 MED ORDER — ONDANSETRON 4 MG PO TBDP
4.0000 mg | ORAL_TABLET | Freq: Once | ORAL | Status: AC
  Administered 2015-11-22: 4 mg via ORAL
  Filled 2015-11-22: qty 1

## 2015-11-22 MED ORDER — TRAMADOL HCL 50 MG PO TABS
50.0000 mg | ORAL_TABLET | Freq: Once | ORAL | Status: AC
  Administered 2015-11-22: 50 mg via ORAL
  Filled 2015-11-22: qty 1

## 2015-11-22 MED ORDER — TRAMADOL HCL 50 MG PO TABS: 50.0000 mg | ORAL_TABLET | Freq: Four times a day (QID) | ORAL | Status: DC | PRN

## 2015-11-22 NOTE — ED Notes (Signed)
Patient tolerated PO challenge

## 2015-11-22 NOTE — ED Provider Notes (Signed)
CSN: [id number]     Arrival date & time 11/21/15  1705 History  By signing my name below, I, Lyndel Safe, attest that this documentation has been prepared under the direction and in the presence of Rolan Bucco, MD. Electronically Signed: Lyndel Safe, ED Scribe. 11/22/2015. 1:29 AM.   Chief Complaint  Patient presents with  . Back Pain   The history is provided by the patient. No language interpreter was used.   HPI Comments: Kathryn Peters is a 20 y.o. female who presents to the Emergency Department complaining of posterior neck pain that has been chronic since a fall that occurred ~2 months ago, with radiation of pain to left mid arm and numbness in left fingers, predominantly in the 4th and 5th left fingers onset 1 week ago. She was evaluated at [facility] 12 days ago for the same complaint of C-spine pain when a CT and Xray of the cervical spine were obtained and were negative for fracture but CT revealed positive degenerative discs in C spine. Pt is awaiting evaluation by neurosurgery. She has been taking ibuprofen and flexeril without significant relief.   She also reports that it 'hurts to breathe' and a cough onset today. She was febrile at 100.33F on initial triage vitals, which has since resolved. Pt notes no fever prior to this evening. She denies abdominal pain, any urinary symptoms, nausea, vomiting, diarrhea, or rhinorrhea.   Pt additionally reports 'boils' in bilateral axillas that are chronic, and have been worsening in pain. She notes a history of 1 incision and drainage procedure peformed to the areas.    Past Medical History  Diagnosis Date  . 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 Tonsillectomy     Family History  Problem Relation Age of Onset  . Hypertension Father    Social History  Substance Use Topics  . Smoking status: Never Smoker   . Smokeless tobacco: None  . Alcohol Use: No   OB History    No  data available     Review of Systems  Constitutional: Positive for fever. Negative for chills, diaphoresis and fatigue.  HENT: Negative for congestion, rhinorrhea and sneezing.   Eyes: Negative.   Respiratory: Positive for cough. Negative for chest tightness and shortness of breath.   Cardiovascular: Negative for chest pain and leg swelling.  Gastrointestinal: Negative for nausea, vomiting, abdominal pain, diarrhea and blood in stool.  Genitourinary: Negative for dysuria, urgency, frequency, hematuria, flank pain and difficulty urinating.  Musculoskeletal: Positive for arthralgias ( left arm ) and neck pain. Negative for back pain.  Skin: Negative for rash and wound.  Neurological: Positive for numbness ( left fingers). Negative for dizziness, speech difficulty, weakness and headaches.   Allergies  Strawberry extract  Home Medications   Prior to Admission medications   Medication Sig Start Date End Date Taking? Authorizing Provider  aspirin-acetaminophen-caffeine (EXCEDRIN MIGRAINE) [phone] MG tablet Take 1 tablet by mouth every 6 (six) hours as needed (for pain).   Yes Historical Provider, MD  cyclobenzaprine (FLEXERIL) 5 MG tablet Take 1-2 tablets (5-10 mg total) by mouth at bedtime. 11/09/15  Yes Wallis Bamberg, PA-C  ibuprofen (ADVIL,MOTRIN) 200 MG tablet Take 400 mg by mouth every 6 (six) hours as needed for moderate pain.   Yes Historical Provider, MD  naproxen sodium (ANAPROX DS) 550 MG tablet Take 1 tablet (550 mg total) by mouth 2 (two) times daily with a meal. 11/09/15  Yes Marquita Palms  Mani, PA-C  sulfamethoxazole-trimethoprim (BACTRIM DS,SEPTRA DS) 800-160 MG tablet Take 1 tablet by mouth 2 (two) times daily. 11/22/15 11/29/15  Rolan Bucco, MD  traMADol (ULTRAM) 50 MG tablet Take 1 tablet (50 mg total) by mouth every 6 (six) hours as needed. 11/22/15   Rolan Bucco, MD   BP 113/83 mmHg  Pulse 97  Temp(Src) 98.6 F (37 C) (Oral)  Resp 20  SpO2 100%  LMP 10/19/2015 (Within  Days) Physical Exam  Constitutional: She is oriented to person, place, and time. She appears well-developed and well-nourished.  HENT:  Head: Normocephalic and atraumatic.  Eyes: Pupils are equal, round, and reactive to light.  Neck: Normal range of motion. Neck supple.  Cardiovascular: Normal rate, regular rhythm and normal heart sounds.   Pulmonary/Chest: Effort normal and breath sounds normal. No respiratory distress. She has no wheezes. She has no rales. She exhibits no tenderness.  Abdominal: Soft. Bowel sounds are normal. There is no tenderness. There is no rebound and no guarding.  Musculoskeletal: Normal range of motion. She exhibits no edema.  Positive tenderness to the lower cervical spine area. No step-offs or deformities noted. There is no pain to the thoracic or lumbosacral spine. There is pain over the left trapezius muscle. Radial pulses are intact.  Lymphadenopathy:    She has no cervical adenopathy.  Neurological: She is alert and oriented to person, place, and time.  She has normal motor function in the upper extremities. She has some diminished sensation to light touch in the ulnar side of the left hand. She has good grip strength bilaterally. Pulses are intact. There is no skin discoloration.  Skin: Skin is warm and dry. No rash noted.  Patient has evidence of hidradenitis suppurotiva bilaterally.  She has some drainage from open wounds under her right axilla. There some mild induration but no fluctuance.  Psychiatric: She has a normal mood and affect.    ED Course  Procedures  DIAGNOSTIC STUDIES: Oxygen Saturation is 100% on RA, normal by my interpretation.    COORDINATION OF CARE: 12:51 AM Discussed treatment plan which includes chest Xray and urinalysis and pt agreed to plan.   Labs Review Labs Reviewed  URINALYSIS, ROUTINE W REFLEX MICROSCOPIC (NOT AT [facility]) - Abnormal; Notable for the following:    APPearance CLOUDY (*)    Ketones, ur 15 (*)    All other  components within normal limits  PREGNANCY, URINE    Imaging Review Dg Chest 2 View  11/22/2015  CLINICAL DATA:  Acute onset of dyspnea and cough. Fever. Initial encounter. EXAM: CHEST  2 VIEW COMPARISON:  Chest radiograph performed 03/07/2014 FINDINGS: The lungs are well-aerated and clear. There is no evidence of focal opacification, pleural effusion or pneumothorax. The heart is borderline normal in size. No acute osseous abnormalities are seen. IMPRESSION: No acute cardiopulmonary process seen. Electronically Signed   By: Roanna Raider M.D.   On: 11/22/2015 01:25   I have personally reviewed and evaluated these images and lab results as part of my medical decision-making.   MDM   Final diagnoses:  Cervical radicular pain  Hidradenitis suppurativa of right axilla    Patient presents with cervical pain with radiculopathy. The symptoms have been persistent since December. She does report some numbness in her fourth and fifth fingers. There is no weakness in the hand. Her mom advises that they are trying to get her an appointment with a neurosurgeon. They're still awaiting an appointment. She's been taking ibuprofen and Tylenol at home  without relief. I will prescribe tramadol for symptomatic relief. I feel that patient will need an outpatient MRI which can be obtained from her PCP or a neurosurgeon.  Patient also was noted to have a slightly elevated temperature of 100.2. She doesn't have any other symptoms that would be more suggestive of infection although she does have a mild cough. CXR  was obtained which did not show any evidence of pneumonia. There is no evidence of a UTI. She does have evidence of hidradenitis Suppurotiva  bilaterally. She has some small draining wounds in her right axilla. There is no fluctuance or wounds that appear to need incision and drainage. I will start her on Bactrim. I advised her use warm compresses. Return precautions were given.  I personally performed the  services described in this documentation, which was scribed in my presence.  The recorded information has been reviewed and considered.    Rolan Bucco, MD 11/22/15 [phone]

## 2015-11-22 NOTE — Discharge Instructions (Signed)
Cervical Radiculopathy Cervical radiculopathy happens when a nerve in the neck (cervical nerve) is pinched or bruised. This condition can develop because of an injury or as part of the normal aging process. Pressure on the cervical nerves can cause pain or numbness that runs from the neck all the way down into the arm and fingers. Usually, this condition gets better with rest. Treatment may be needed if the condition does not improve.  CAUSES This condition may be caused by:  Injury.  Slipped (herniated) disk.  Muscle tightness in the neck because of overuse.  Arthritis.  Breakdown or degeneration in the bones and joints of the spine (spondylosis) due to aging.  Bone spurs that may develop near the cervical nerves. SYMPTOMS Symptoms of this condition include:  Pain that runs from the neck to the arm and hand. The pain can be severe or irritating. It may be worse when the neck is moved.  Numbness or weakness in the affected arm and hand. DIAGNOSIS This condition may be diagnosed based on symptoms, medical history, and a physical exam. You may also have tests, including:  X-rays.  CT scan.  MRI.  Electromyogram (EMG).  Nerve conduction tests. TREATMENT In many cases, treatment is not needed for this condition. With rest, the condition usually gets better over time. If treatment is needed, options may include:  Wearing a soft neck collar for short periods of time.  Physical therapy to strengthen your neck muscles.  Medicines, such as NSAIDs, oral corticosteroids, or spinal injections.  Surgery. This may be needed if other treatments do not help. Various types of surgery may be done depending on the cause of your problems. HOME CARE INSTRUCTIONS Managing Pain  Take over-the-counter and prescription medicines only as told by your health care provider.  If directed, apply ice to the affected area.  Put ice in a plastic bag.  Place a towel between your skin and the  bag.  Leave the ice on for 20 minutes, 2-3 times per day.  If ice does not help, you can try using heat. Take a warm shower or warm bath, or use a heat pack as told by your health care provider.  Try a gentle neck and shoulder massage to help relieve symptoms. Activity  Rest as needed. Follow instructions from your health care provider about any restrictions on activities.  Do stretching and strengthening exercises as told by your health care provider or physical therapist. General Instructions  If you were given a soft collar, wear it as told by your health care provider.  Use a flat pillow when you sleep.  Keep all follow-up visits as told by your health care provider. This is important. SEEK MEDICAL CARE IF:  Your condition does not improve with treatment. SEEK IMMEDIATE MEDICAL CARE IF:  Your pain gets much worse and cannot be controlled with medicines.  You have weakness or numbness in your hand, arm, face, or leg.  You have a high fever.  You have a stiff, rigid neck.  You lose control of your bowels or your bladder (have incontinence).  You have trouble with walking, balance, or speaking.   This information is not intended to replace advice given to you by your health care provider. Make sure you discuss any questions you have with your health care provider.   Document Released: 06/22/2001 Document Revised: 06/18/2015 Document Reviewed: 11/21/2014 Elsevier Interactive Patient Education 2016 [employer]  Hidradenitis Suppurativa Hidradenitis suppurativa is a long-term (chronic) skin disease that starts with  blocked sweat glands or hair follicles. Bacteria may grow in these blocked openings of your skin. Hidradenitis suppurativa is like a severe form of acne that develops in areas of your body where acne would be unusual. It is most likely to affect the areas of your body where skin rubs against skin and becomes moist. This includes  your:  Underarms.  Groin.  Genital areas.  Buttocks.  Upper thighs.  Breasts. Hidradenitis suppurativa may start out with small pimples. The pimples can develop into deep sores that break open (rupture) and drain pus. Over time your skin may thicken and become scarred. Hidradenitis suppurativa cannot be passed from person to person.  CAUSES  The exact cause of hidradenitis suppurativa is not known. This condition may be due to:  Female and female hormones. The condition is rare before and after puberty.  An overactive body defense system (immune system). Your immune system may overreact to the blocked hair follicles or sweat glands and cause swelling and pus-filled sores. RISK FACTORS You may have a higher risk of hidradenitis suppurativa if you:  Are a woman.  Are between ages 64 and 24.  Have a family history of hidradenitis suppurativa.  Have a personal history of acne.  Are overweight.  Smoke.  Take the drug lithium. SIGNS AND SYMPTOMS  The first signs of an outbreak are usually painful skin bumps that look like pimples. As the condition progresses:  Skin bumps may get bigger and grow deeper into the skin.  Bumps under the skin may rupture and drain smelly pus.  Skin may become itchy and infected.  Skin may thicken and scar.  Drainage may continue through tunnels under the skin (fistulas).  Walking and moving your arms can become painful. DIAGNOSIS  Your health care provider may diagnose hidradenitis suppurativa based on your medical history and your signs and symptoms. A physical exam will also be done. You may need to see a health care provider who specializes in skin diseases (dermatologist). You may also have tests done to confirm the diagnosis. These can include:  Swabbing a sample of pus or drainage from your skin so it can be sent to the lab and tested for infection.  Blood tests to check for infection. TREATMENT  The same treatment will not work for  everybody with hidradenitis suppurativa. Your treatment will depend on how severe your symptoms are. You may need to try several treatments to find what works best for you. Part of your treatment may include cleaning and bandaging (dressing) your wounds. You may also have to take medicines, such as the following:  Antibiotics.  Acne medicines.  Medicines to block or suppress the immune system.  A diabetes medicine (metformin) is sometimes used to treat this condition.  For women, birth control pills can sometimes help relieve symptoms. You may need surgery if you have a severe case of hidradenitis suppurativa that does not respond to medicine. Surgery may involve:   Using a laser to clear the skin and remove hair follicles.  Opening and draining deep sores.  Removing the areas of skin that are diseased and scarred. HOME CARE INSTRUCTIONS  Learn as much as you can about your disease, and work closely with your health care providers.  Take medicines only as directed by your health care provider.  If you were prescribed an antibiotic medicine, finish it all even if you start to feel better.  If you are overweight, losing weight may be very helpful. Try to reach and maintain  a healthy weight.  Do not use any tobacco products, including cigarettes, chewing tobacco, or electronic cigarettes. If you need help quitting, ask your health care provider.  Do not shave the areas where you get hidradenitis suppurativa.  Do not wear deodorant.  Wear loose-fitting clothes.  Try not to overheat and get sweaty.  Take a daily bleach bath as directed by your health care provider.  Fill your bathtub halfway with water.  Pour in  cup of unscented household bleach.  Soak for 5-10 minutes.  Cover sore areas with a warm, clean washcloth (compress) for 5-10 minutes. SEEK MEDICAL CARE IF:   You have a flare-up of hidradenitis suppurativa.  You have chills or a fever.  You are having  trouble controlling your symptoms at home.   This information is not intended to replace advice given to you by your health care provider. Make sure you discuss any questions you have with your health care provider.   Document Released: 05/11/2004 Document Revised: 10/18/2014 Document Reviewed: 12/28/2013 Elsevier Interactive Patient Education [employer]

## 2015-11-22 NOTE — ED Notes (Signed)
Patient d/c'd self care.  F/U and medications reviewed.  Patient verbalized understanding. 

## 2015-12-31 ENCOUNTER — Encounter (HOSPITAL_COMMUNITY): Payer: Self-pay | Admitting: Emergency Medicine

## 2015-12-31 ENCOUNTER — Emergency Department (HOSPITAL_COMMUNITY)
Admission: EM | Admit: 2015-12-31 | Discharge: 2015-12-31 | Disposition: A | Discharge status: Home / Self Care | Payer: Managed Care, Other (non HMO) | Attending: Emergency Medicine | Admitting: Emergency Medicine

## 2015-12-31 DIAGNOSIS — Z862 Personal history of diseases of the blood and blood-forming organs and certain disorders involving the immune mechanism: Secondary | ICD-10-CM | POA: Insufficient documentation

## 2015-12-31 DIAGNOSIS — Y998 Other external cause status: Secondary | ICD-10-CM | POA: Insufficient documentation

## 2015-12-31 DIAGNOSIS — Y9289 Other specified places as the place of occurrence of the external cause: Secondary | ICD-10-CM | POA: Diagnosis not present

## 2015-12-31 DIAGNOSIS — Y9389 Activity, other specified: Secondary | ICD-10-CM | POA: Insufficient documentation

## 2015-12-31 DIAGNOSIS — T7840XA Allergy, unspecified, initial encounter: Secondary | ICD-10-CM | POA: Diagnosis present

## 2015-12-31 DIAGNOSIS — X58XXXA Exposure to other specified factors, initial encounter: Secondary | ICD-10-CM | POA: Diagnosis not present

## 2015-12-31 DIAGNOSIS — R0602 Shortness of breath: Secondary | ICD-10-CM | POA: Diagnosis not present

## 2015-12-31 MED ORDER — DIPHENHYDRAMINE HCL 25 MG PO CAPS: 25.0000 mg | ORAL_CAPSULE | Freq: Four times a day (QID) | ORAL | Status: DC | PRN

## 2015-12-31 MED ORDER — DEXAMETHASONE SODIUM PHOSPHATE 10 MG/ML IJ SOLN
10.0000 mg | Freq: Once | INTRAMUSCULAR | Status: AC
  Administered 2015-12-31: 10 mg via INTRAVENOUS
  Filled 2015-12-31: qty 1

## 2015-12-31 MED ORDER — EPINEPHRINE 0.3 MG/0.3ML IJ SOAJ
0.3000 mg | Freq: Once | INTRAMUSCULAR | Status: AC
  Administered 2015-12-31: 0.3 mg via INTRAMUSCULAR
  Filled 2015-12-31: qty 0.3

## 2015-12-31 MED ORDER — DIPHENHYDRAMINE HCL 50 MG/ML IJ SOLN
25.0000 mg | Freq: Once | INTRAMUSCULAR | Status: AC
  Administered 2015-12-31: 25 mg via INTRAVENOUS
  Filled 2015-12-31: qty 1

## 2015-12-31 MED ORDER — FAMOTIDINE IN NACL 20-0.9 MG/50ML-% IV SOLN
20.0000 mg | Freq: Once | INTRAVENOUS | Status: AC
  Administered 2015-12-31: 20 mg via INTRAVENOUS
  Filled 2015-12-31: qty 50

## 2015-12-31 MED ORDER — PREDNISONE 10 MG PO TABS: 60.0000 mg | ORAL_TABLET | Freq: Every day | ORAL | Status: DC

## 2015-12-31 NOTE — ED Notes (Signed)
Pt states she had pineapple earlier and now has tingling in her throat and feels SOB. Swelling in lips and tongue. Alert and oriente.d

## 2015-12-31 NOTE — Discharge Instructions (Signed)
We saw you in the ER after you had the allergic reaction.  The reaction is severe, however, it appears to be in control and there is no increased swelling or any difficulty in breathing noted. We are not sure what caused the reaction, and it is important for you to follow up with an allergist. Please take the medications prescribed. PLEASE RETURN TO THE ER IMMEDIATELY IN CASE YOU START HAVING WORSENING SWELLING, DIFFICULTY IN BREATHING ETC.  Angioedema Angioedema is a sudden swelling of tissues, often of the skin. It can occur on the face or genitals or in the abdomen or other body parts. The swelling usually develops over a short period and gets better in 24 to 48 hours. It often begins during the night and is found when the person wakes up. The person may also get red, itchy patches of skin (hives). Angioedema can be dangerous if it involves swelling of the air passages.  Depending on the cause, episodes of angioedema may only happen once, come back in unpredictable patterns, or repeat for several years and then gradually fade away.  CAUSES  Angioedema can be caused by an allergic reaction to various triggers. It can also result from nonallergic causes, including reactions to drugs, immune system disorders, viral infections, or an abnormal gene that is passed to you from your parents (hereditary). For some people with angioedema, the cause is unknown.  Some things that can trigger angioedema include:   Foods.   Medicines, such as ACE inhibitors, ARBs, nonsteroidal anti-inflammatory agents, or estrogen.   Latex.   Animal saliva.   Insect stings.   Dyes used in X-rays.   Mild injury.   Dental work.  Surgery.  Stress.   Sudden changes in temperature.   Exercise. SIGNS AND SYMPTOMS   Swelling of the skin.  Hives. If these are present, there is also intense itching.  Redness in the affected area.   Pain in the affected area.  Swollen lips or tongue.  Breathing  problems. This may happen if the air passages swell.  Wheezing. If internal organs are involved, there may be:   Nausea.   Abdominal pain.   Vomiting.   Difficulty swallowing.   Difficulty passing urine. DIAGNOSIS   Your health care provider will examine the affected area and take a medical and family history.  Various tests may be done to help determine the cause. Tests may include:  Allergy skin tests to see if the problem is an allergic reaction.   Blood tests to check for hereditary angioedema.   Tests to check for underlying diseases that could cause the condition.   A review of your medicines, including over-the-counter medicines, may be done. TREATMENT  Treatment will depend on the cause of the angioedema. Possible treatments include:   Removal of anything that triggered the condition (such as stopping certain medicines).   Medicines to treat symptoms or prevent attacks. Medicines given may include:   Antihistamines.   Epinephrine injection.   Steroids.   Hospitalization may be required for severe attacks. If the air passages are affected, it can be an emergency. Tubes may need to be placed to keep the airway open. HOME CARE INSTRUCTIONS   Take all medicines as directed by your health care provider.  If you were given medicines for emergency allergy treatment, always carry them with you.  Wear a medical bracelet as directed by your health care provider.   Avoid known triggers. SEEK MEDICAL CARE IF:   You have repeat attacks  of angioedema.   Your attacks are more frequent or more severe despite preventive measures.   You have hereditary angioedema and are considering having children. It is important to discuss with your health care provider the risks of passing the condition on to your children. SEEK IMMEDIATE MEDICAL CARE IF:   You have severe swelling of the mouth, tongue, or lips.  You have difficulty breathing.   You have  difficulty swallowing.   You faint. MAKE SURE YOU:  Understand these instructions.  Will watch your condition.  Will get help right away if you are not doing well or get worse.   This information is not intended to replace advice given to you by your health care provider. Make sure you discuss any questions you have with your health care provider.   Document Released: 12/06/2001 Document Revised: 10/18/2014 Document Reviewed: 05/21/2013 Elsevier Interactive Patient Education [employer]

## 2015-12-31 NOTE — ED Provider Notes (Signed)
CSN: [id number]     Arrival date & time 12/31/15  0250 History  By signing my name below, I, Iona Beard, attest that this documentation has been prepared under the direction and in the presence of No att. providers found.   Electronically Signed: Iona Beard, ED Scribe. 12/31/2015. 6:29 PM  Chief Complaint  Patient presents with  . Allergic Reaction    The history is provided by the patient. No language interpreter was used.   HPI Comments: Kathryn Peters is a 20 y.o. female with PMHx of Judi Cong George's syndrome who presents to the Emergency Department complaining of sudden onset, constant allergic reaction, onset around an hour ago. Pt states she ate chicken around 7 pm and had pineapple with the chicken. 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 Pt reports associated tongue swelling, and difficulty breathing. No worsening or alleviating factors noted. Pt denies voice change, drooling, itching, sore throat, new medications, or any other pertinent symptoms.    Past Medical History  Diagnosis Date  . Di George's syndrome [facility])    Past Surgical History  Procedure Laterality Date  . Tonsillectomy     Family History  Problem Relation Age of Onset  . Hypertension Father    Social History  Substance Use Topics  . Smoking status: Never Smoker   . Smokeless tobacco: None  . Alcohol Use: No   OB History    No data available     Review of Systems  Respiratory: Positive for shortness of breath.   Allergic/Immunologic: Positive for food allergies.       Strawberry allergy but states she did not eat strawberries tonight.   Neurological: Negative for speech difficulty.  All other systems reviewed and are negative.   Allergies  Pineapple and Strawberry extract  Home Medications   Prior to Admission medications   Medication Sig Start Date End Date Taking? Authorizing Provider  ibuprofen (ADVIL,MOTRIN) 200 MG tablet Take 400 mg by mouth  every 6 (six) hours as needed for moderate pain.   Yes Historical Provider, MD  cyclobenzaprine (FLEXERIL) 5 MG tablet Take 1-2 tablets (5-10 mg total) by mouth at bedtime. Patient not taking: Reported on 12/31/2015 11/09/15   Wallis Bamberg, PA-C  diphenhydrAMINE (BENADRYL) 25 mg capsule Take 1 capsule (25 mg total) by mouth every 6 (six) hours as needed for itching. 12/31/15   Derwood Kaplan, MD  naproxen sodium (ANAPROX DS) 550 MG tablet Take 1 tablet (550 mg total) by mouth 2 (two) times daily with a meal. Patient not taking: Reported on 12/31/2015 11/09/15   Wallis Bamberg, PA-C  predniSONE (DELTASONE) 10 MG tablet Take 6 tablets (60 mg total) by mouth daily. 12/31/15   Derwood Kaplan, MD  traMADol (ULTRAM) 50 MG tablet Take 1 tablet (50 mg total) by mouth every 6 (six) hours as needed. Patient not taking: Reported on 12/31/2015 11/22/15   Rolan Bucco, MD   BP 127/93 mmHg  Pulse 128  Temp(Src) 97.7 F (36.5 C) (Oral)  Resp 18  SpO2 99%  LMP 12/24/2015 (Approximate) Physical Exam  Constitutional: She is oriented to person, place, and time. She appears well-developed and well-nourished. No distress.  HENT:  Head: Normocephalic and atraumatic.  No drooling, no gross oral swelling, no trismus.   Eyes: EOM are normal.  Neck: Normal range of motion.  Cardiovascular: Normal rate, regular rhythm and normal heart sounds.   Pulmonary/Chest: Effort normal and breath sounds normal. No stridor.  Abdominal: Soft. She exhibits  no distension. There is no tenderness.  Musculoskeletal: Normal range of motion.  Neurological: She is alert and oriented to person, place, and time.  Skin: Skin is warm and dry.  Psychiatric: She has a normal mood and affect. Judgment normal.  Nursing note and vitals reviewed.   ED Course  Procedures (including critical care time) DIAGNOSTIC STUDIES: Oxygen Saturation is 99% on RA, normal by my interpretation.    COORDINATION OF CARE: 3:05 AM-Discussed treatment plan which  includes epi-pen, benadryl, and pepcid with pt at bedside and pt agreed to plan.   Labs Review Labs Reviewed - No data to display  Imaging Review No results found.   EKG Interpretation None      MDM   Final diagnoses:  Allergic reaction, initial encounter   I personally performed the services described in this documentation, which was scribed in my presence. The recorded information has been reviewed and is accurate.  PT comes in with cc of feeling that her throat is swelling. She has known hx of reaction to strawberry.  Oral exam is normal, lungs are clear. Hx of anxiety - but that would be a diagnosis of exclusion.  Will observe for 4 hours.     Derwood KaplanAnkit Raina Sole, MD 01/02/16 [phone]

## 2016-05-16 ENCOUNTER — Emergency Department (HOSPITAL_COMMUNITY)
Admission: EM | Admit: 2016-05-16 | Discharge: 2016-05-16 | Disposition: A | Discharge status: Home / Self Care | Payer: Managed Care, Other (non HMO) | Attending: Emergency Medicine | Admitting: Emergency Medicine

## 2016-05-16 ENCOUNTER — Encounter (HOSPITAL_COMMUNITY): Payer: Self-pay | Admitting: Oncology

## 2016-05-16 DIAGNOSIS — T7840XA Allergy, unspecified, initial encounter: Secondary | ICD-10-CM | POA: Diagnosis not present

## 2016-05-16 DIAGNOSIS — Z79899 Other long term (current) drug therapy: Secondary | ICD-10-CM | POA: Insufficient documentation

## 2016-05-16 MED ORDER — DIPHENHYDRAMINE HCL 25 MG PO CAPS
25.0000 mg | ORAL_CAPSULE | Freq: Once | ORAL | Status: AC
  Administered 2016-05-16: 25 mg via ORAL
  Filled 2016-05-16: qty 1

## 2016-05-16 NOTE — ED Notes (Signed)
No respiratory or acute distress noted alert and oriented x 3 call light in reach no reaction to medication noted clear speech noted no drooling noted family at bedside.

## 2016-05-16 NOTE — ED Triage Notes (Signed)
Pt states that it hurts to breath and her tongue hurts as well.  Pt reports having difficulty breathing prior to eating peach cobbler however it got worse shortly after.  States she has eaten peaches in the past w/o any issues.  Pt is speaking in full sentences.  In NAD.  Lung sounds clear b/l.

## 2016-05-16 NOTE — Discharge Instructions (Signed)
Please monitor for new or worsening signs or symptoms, return immediately if any present. °

## 2016-05-16 NOTE — ED Notes (Signed)
Bed: WA05 Expected date:  Expected time:  Means of arrival:  Comments: 

## 2016-05-16 NOTE — ED Provider Notes (Signed)
  [facility]-EMERGENCY DEPT Provider Note   CSN: [id number] Arrival date & time: 05/16/16  0213  First Provider Contact:  None     History   Chief Complaint Chief Complaint  Patient presents with  . Allergic Reaction    HPI Kathryn Peters is a 20 y.o. female.  HPI  Eating peaches around 1pm- 30 minutes late tongue started feeling funny, difficulty talking   Now having sore throat, no diffuclty breathing    Past Medical History:  Diagnosis Date  . Di George's syndrome (HCC)     There are no active problems to display for this patient.   Past Surgical History:  Procedure Laterality Date  . TONSILLECTOMY      OB History    Gravida Para Term Preterm AB Living   1             SAB TAB Ectopic Multiple Live Births                   Home Medications    Prior to Admission medications   Medication Sig Start Date End Date Taking? Authorizing Provider  naproxen (NAPROSYN) 500 MG tablet Take 500 mg by mouth 2 (two) times daily as needed for mild pain or moderate pain.   Yes Historical Provider, MD    Family History Family History  Problem Relation Age of Onset  . Hypertension Father     Social History Social History  Substance Use Topics  . Smoking status: Never Smoker  . Smokeless tobacco: Never Used  . Alcohol use No     Allergies   Food and Pineapple   Review of Systems Review of Systems   Physical Exam Updated Vital Signs BP 140/72 (BP Location: Left Arm)   Pulse 107   Temp 98.9 F (37.2 C) (Axillary) Comment: Pt refused oral temperature due to swelling  Resp 16   Ht 5\' 5"  (1.651 m)   Wt 91.6 kg   LMP 05/09/2016 (Approximate)   SpO2 100%   BMI 33.61 kg/m   Physical Exam   ED Treatments / Results  Labs (all labs ordered are listed, but only abnormal results are displayed) Labs Reviewed - No data to display  EKG  EKG Interpretation None       Radiology No results found.  Procedures Procedures (including critical care  time)  Medications Ordered in ED Medications - No data to display   Initial Impression / Assessment and Plan / ED Course  I have reviewed the triage vital signs and the nursing notes.  Pertinent labs & imaging results that were available during my care of the patient were reviewed by me and considered in my medical decision making (see chart for details).  Clinical Course     Final Clinical Impressions(s) / ED Diagnoses   Final diagnoses:  None    Labs:  Imaging:  Consults:  Therapeutics:  Discharge Meds:   Assessment/Plan:      New Prescriptions New Prescriptions   No medications on file     Eyvonne MechanicJeffrey Tamyah Cutbirth, PA-C 05/16/16 [phone]    Derwood KaplanAnkit Nanavati, MD 05/16/16 [phone]

## 2016-06-08 ENCOUNTER — Other Ambulatory Visit: Payer: Self-pay | Admitting: Specialist

## 2016-06-08 DIAGNOSIS — R519 Headache, unspecified: Secondary | ICD-10-CM

## 2016-06-08 DIAGNOSIS — R51 Headache: Principal | ICD-10-CM

## 2016-07-07 ENCOUNTER — Other Ambulatory Visit: Payer: Self-pay | Admitting: Specialist

## 2016-07-07 ENCOUNTER — Ambulatory Visit
Admission: RE | Admit: 2016-07-07 | Discharge: 2016-07-07 | Disposition: A | Discharge status: Home / Self Care | Payer: Managed Care, Other (non HMO) | Source: Ambulatory Visit | Attending: Specialist | Admitting: Specialist

## 2016-07-07 DIAGNOSIS — R51 Headache: Principal | ICD-10-CM

## 2016-07-07 DIAGNOSIS — R519 Headache, unspecified: Secondary | ICD-10-CM

## 2016-07-08 ENCOUNTER — Ambulatory Visit (HOSPITAL_COMMUNITY)
Admission: EM | Admit: 2016-07-08 | Discharge: 2016-07-08 | Disposition: A | Discharge status: Home / Self Care | Payer: Managed Care, Other (non HMO) | Attending: Emergency Medicine | Admitting: Emergency Medicine

## 2016-07-08 ENCOUNTER — Encounter (HOSPITAL_COMMUNITY): Payer: Self-pay | Admitting: Emergency Medicine

## 2016-07-08 ENCOUNTER — Ambulatory Visit (INDEPENDENT_AMBULATORY_CARE_PROVIDER_SITE_OTHER): Payer: Managed Care, Other (non HMO)

## 2016-07-08 DIAGNOSIS — J40 Bronchitis, not specified as acute or chronic: Secondary | ICD-10-CM

## 2016-07-08 DIAGNOSIS — J069 Acute upper respiratory infection, unspecified: Secondary | ICD-10-CM | POA: Diagnosis present

## 2016-07-08 DIAGNOSIS — Z79899 Other long term (current) drug therapy: Secondary | ICD-10-CM | POA: Diagnosis not present

## 2016-07-08 DIAGNOSIS — J0100 Acute maxillary sinusitis, unspecified: Secondary | ICD-10-CM | POA: Diagnosis not present

## 2016-07-08 LAB — POCT RAPID STREP A: STREPTOCOCCUS, GROUP A SCREEN (DIRECT): NEGATIVE

## 2016-07-08 MED ORDER — AMOXICILLIN 875 MG PO TABS: 875.0000 mg | ORAL_TABLET | Freq: Two times a day (BID) | ORAL | 0 refills | Status: AC

## 2016-07-08 NOTE — ED Triage Notes (Signed)
Pt c/o ST onset x3 weeks associated w/cough, HA, bloody sputum   Denies fevers, chills   A&O x4... NAD

## 2016-07-08 NOTE — ED Provider Notes (Signed)
CSN: [id number]     Arrival date & time 07/08/16  1815 History   First MD Initiated Contact with Patient 07/08/16 1922     Chief Complaint  Patient presents with  . URI   (Consider location/radiation/quality/duration/timing/severity/associated sxs/prior Treatment) 20 year old female presents with sore throat, nasal congestion and cough for the past 3 weeks. Now coughing up bloody sputum. Also experiencing headache, sinus pressure. Denies any fever, chills, weight loss. Has been taking Allegra and Delsym with minimal relief. Has a developmental disorder (DiGeorge's syndrome) and is accompanied by her dad. Takes no daily medication.    The history is provided by the patient and a parent.    Past Medical History:  Diagnosis Date  . Di George's syndrome [facility])    Past Surgical History:  Procedure Laterality Date  . TONSILLECTOMY     Family History  Problem Relation Age of Onset  . Hypertension Father    Social History  Substance Use Topics  . Smoking status: Never Smoker  . Smokeless tobacco: Never Used  . Alcohol use No   OB History    Gravida Para Term Preterm AB Living   1             SAB TAB Ectopic Multiple Live Births                 Review of Systems  Constitutional: Positive for fatigue. Negative for appetite change, chills, fever and unexpected weight change.  HENT: Positive for congestion, ear pain, rhinorrhea, sinus pressure and sore throat.   Respiratory: Positive for cough. Negative for shortness of breath and wheezing.   Cardiovascular: Negative for chest pain.  Gastrointestinal: Negative for abdominal pain, diarrhea, nausea and vomiting.  Genitourinary: Negative for difficulty urinating.  Musculoskeletal: Negative for arthralgias and myalgias.  Neurological: Positive for headaches. Negative for dizziness, weakness, light-headedness and numbness.    Allergies  Food and Pineapple  Home Medications   Prior to Admission medications   Medication Sig Start  Date End Date Taking? Authorizing Provider  amoxicillin (AMOXIL) 875 MG tablet Take 1 tablet (875 mg total) by mouth 2 (two) times daily. 07/08/16 07/15/16  Sudie Grumbling, NP  naproxen (NAPROSYN) 500 MG tablet Take 500 mg by mouth 2 (two) times daily as needed for mild pain or moderate pain.    Historical Provider, MD   Meds Ordered and Administered this Visit  Medications - No data to display  BP 121/71 (BP Location: Left Arm)   Pulse 93   Temp 98.1 F (36.7 C) (Oral)   Resp 18   LMP 07/07/2016   SpO2 100%  No data found.   Physical Exam  Constitutional: She is oriented to person, place, and time. She appears well-developed and well-nourished. No distress.  HENT:  Head: Atraumatic. Macrocephalic.  Right Ear: Hearing, tympanic membrane, external ear and ear canal normal.  Left Ear: Hearing, tympanic membrane, external ear and ear canal normal.  Nose: Mucosal edema and rhinorrhea present. Right sinus exhibits maxillary sinus tenderness and frontal sinus tenderness. Left sinus exhibits maxillary sinus tenderness and frontal sinus tenderness.  Mouth/Throat: Uvula is midline and mucous membranes are normal. Posterior oropharyngeal erythema present.  Bilateral ear canals 90% filled with ear wax  Neck: Normal range of motion. Neck supple.  Cardiovascular: Normal rate, regular rhythm and normal heart sounds.   Pulmonary/Chest: Effort normal. She has decreased breath sounds in the right upper field, the right lower field, the left upper field and the left lower field.  She has no wheezes. She has rhonchi in the right upper field, the right lower field, the left upper field and the left lower field.  Lymphadenopathy:    She has no cervical adenopathy.  Neurological: She is alert and oriented to person, place, and time.  Skin: Skin is warm and dry.  Psychiatric: She has a normal mood and affect. Her behavior is normal. Judgment and thought content normal.    Urgent Care Course   Clinical  Course    Procedures (including critical care time)  Labs Review Labs Reviewed  CULTURE, GROUP A STREP [facility]  POCT RAPID STREP A   patient's menstrual cycle started yesterday- Chart indicates she is pregnant but patient nor father revealed that she was pregnant. May be error.   Imaging Review- Dg Chest 2 View  Result Date: 07/08/2016 CLINICAL DATA:  Sore throat cough and illness for 3 weeks. EXAM: CHEST  2 VIEW COMPARISON:  11/22/2015 FINDINGS: The cardiac silhouette, mediastinal and hilar contours are within normal limits and stable. The lungs are clear. No pleural effusion. The bony thorax is intact. IMPRESSION: No acute cardiopulmonary findings. Electronically Signed   By: Rudie MeyerP.  Gallerani M.D.   On: 07/08/2016 20:26     Visual Acuity Review  Right Eye Distance:   Left Eye Distance:   Bilateral Distance:    Right Eye Near:   Left Eye Near:    Bilateral Near:         MDM   1. Acute maxillary sinusitis, recurrence not specified   2. Bronchitis    Reviewed negative x-ray results and negative strep results with patient and Dad. Recommend Amoxicillin 875mg  twice a day for 7 days- declined Augmentin due to cost with prescription plan. Encouraged to take Sudafed 60mg  every 8 hours as needed for congestion. May continue Delsym as needed for cough. Recommend follow-up with her PCP in 4 to 5 days if not resolving.     Sudie GrumblingAnn Berry Kay Ricciuti, NP 07/09/16 [phone]

## 2016-07-11 LAB — CULTURE, GROUP A STREP (THRC)

## 2016-07-18 ENCOUNTER — Other Ambulatory Visit: Payer: Managed Care, Other (non HMO)

## 2016-08-02 ENCOUNTER — Other Ambulatory Visit (HOSPITAL_COMMUNITY): Payer: Self-pay | Admitting: Family Medicine

## 2016-08-02 DIAGNOSIS — R946 Abnormal results of thyroid function studies: Secondary | ICD-10-CM

## 2016-08-11 ENCOUNTER — Ambulatory Visit (HOSPITAL_COMMUNITY)
Admission: RE | Admit: 2016-08-11 | Discharge: 2016-08-11 | Disposition: A | Discharge status: Home / Self Care | Payer: Managed Care, Other (non HMO) | Source: Ambulatory Visit | Attending: Family Medicine | Admitting: Family Medicine

## 2016-08-11 DIAGNOSIS — R946 Abnormal results of thyroid function studies: Secondary | ICD-10-CM | POA: Insufficient documentation

## 2016-08-11 MED ORDER — SODIUM IODIDE I 131 CAPSULE
10.9000 | Freq: Once | INTRAVENOUS | Status: AC | PRN
Start: 2016-08-11 — End: 2016-08-11
  Administered 2016-08-11: 10.9 via ORAL

## 2016-08-12 ENCOUNTER — Encounter (HOSPITAL_COMMUNITY)
Admission: RE | Admit: 2016-08-12 | Discharge: 2016-08-12 | Disposition: A | Discharge status: Home / Self Care | Payer: Managed Care, Other (non HMO) | Source: Ambulatory Visit | Attending: Family Medicine | Admitting: Family Medicine

## 2016-08-12 DIAGNOSIS — R946 Abnormal results of thyroid function studies: Secondary | ICD-10-CM | POA: Diagnosis not present

## 2016-08-12 MED ORDER — SODIUM PERTECHNETATE TC 99M INJECTION
10.0000 | Freq: Once | INTRAVENOUS | Status: AC | PRN
Start: 2016-08-12 — End: 2016-08-12
  Administered 2016-08-12: 10 via INTRAVENOUS

## 2017-03-09 ENCOUNTER — Encounter (HOSPITAL_COMMUNITY): Payer: Self-pay

## 2017-03-09 ENCOUNTER — Emergency Department (HOSPITAL_COMMUNITY)
Admission: EM | Admit: 2017-03-09 | Discharge: 2017-03-09 | Disposition: A | Discharge status: Home / Self Care | Payer: Managed Care, Other (non HMO) | Attending: Emergency Medicine | Admitting: Emergency Medicine

## 2017-03-09 DIAGNOSIS — Z79899 Other long term (current) drug therapy: Secondary | ICD-10-CM | POA: Diagnosis not present

## 2017-03-09 DIAGNOSIS — T7840XA Allergy, unspecified, initial encounter: Secondary | ICD-10-CM | POA: Diagnosis not present

## 2017-03-09 DIAGNOSIS — D821 Di George's syndrome: Secondary | ICD-10-CM | POA: Diagnosis not present

## 2017-03-09 DIAGNOSIS — R220 Localized swelling, mass and lump, head: Secondary | ICD-10-CM | POA: Diagnosis present

## 2017-03-09 MED ORDER — SODIUM CHLORIDE 0.9 % IV BOLUS (SEPSIS)
1000.0000 mL | Freq: Once | INTRAVENOUS | Status: AC
  Administered 2017-03-09: 1000 mL via INTRAVENOUS

## 2017-03-09 MED ORDER — FAMOTIDINE IN NACL 20-0.9 MG/50ML-% IV SOLN
20.0000 mg | Freq: Once | INTRAVENOUS | Status: AC
  Administered 2017-03-09: 20 mg via INTRAVENOUS
  Filled 2017-03-09: qty 50

## 2017-03-09 MED ORDER — PREDNISONE 20 MG PO TABS: 40.0000 mg | ORAL_TABLET | Freq: Every day | ORAL | 0 refills | Status: DC

## 2017-03-09 MED ORDER — DIPHENHYDRAMINE HCL 50 MG/ML IJ SOLN
25.0000 mg | Freq: Once | INTRAMUSCULAR | Status: AC
  Administered 2017-03-09: 25 mg via INTRAVENOUS
  Filled 2017-03-09: qty 1

## 2017-03-09 MED ORDER — METHYLPREDNISOLONE SODIUM SUCC 125 MG IJ SOLR
125.0000 mg | Freq: Once | INTRAMUSCULAR | Status: AC
  Administered 2017-03-09: 125 mg via INTRAVENOUS
  Filled 2017-03-09: qty 2

## 2017-03-09 NOTE — ED Notes (Signed)
Pt is having oral swelling and feel as if throat is closing. 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 Pt states having difficulty breathing.

## 2017-03-09 NOTE — Discharge Instructions (Signed)
Take prednisone as prescribed until finished. You may supplement this with Benadryl as needed for persistent symptoms. Follow-up with your primary care doctor. You may return to the emergency department for new or concerning symptoms.

## 2017-03-09 NOTE — ED Provider Notes (Signed)
[facility]-EMERGENCY DEPT Provider Note   CSN: [id number] Arrival date & time: 03/09/17  0147     History   Chief Complaint Chief Complaint  Patient presents with  . Oral Swelling    HPI Kathryn Peters is a 21 y.o. female.  21 year old female with a history of DiGeorge syndrome presents to the emergency department for evaluation of oral swelling. Patient states that she awoke from sleep feeling as though her throat was closing. She states that she has an allergy to peaches and held and ice cream cone with peach ice cream at 1700 yesterday. She took an ibuprofen prior to arrival. No inability to swallow, drooling, wheezing, syncope. No other new contacts or ingestions.      Past Medical History:  Diagnosis Date  . Di George's syndrome (HCC)     There are no active problems to display for this patient.   Past Surgical History:  Procedure Laterality Date  . TONSILLECTOMY      OB History    Gravida Para Term Preterm AB Living   1             SAB TAB Ectopic Multiple Live Births                   Home Medications    Prior to Admission medications   Medication Sig Start Date End Date Taking? Authorizing Provider  fexofenadine (ALLEGRA) 60 MG tablet Take 60 mg by mouth daily.   Yes [provider]  ibuprofen (ADVIL,MOTRIN) 200 MG tablet Take 200 mg by mouth every 6 (six) hours as needed for moderate pain.   Yes [provider]  predniSONE (DELTASONE) 20 MG tablet Take 2 tablets (40 mg total) by mouth daily. Take 40 mg by mouth daily for 3 days, then 20mg  by mouth daily for 3 days, then 10mg  daily for 3 days 03/09/17   Antony MaduraHumes, Tamatha Gadbois, PA-C    Family History Family History  Problem Relation Age of Onset  . Hypertension Father     Social History Social History  Substance Use Topics  . Smoking status: Never Smoker  . Smokeless tobacco: Never Used  . Alcohol use No     Allergies   Food and Pineapple   Review of Systems Review of  Systems Ten systems reviewed and are negative for acute change, except as noted in the HPI.    Physical Exam Updated Vital Signs BP 122/90 (BP Location: Left Arm)   Pulse 90   Temp 98.2 F (36.8 C) (Oral)   Resp 16   LMP 07/30/2016   SpO2 97%   Physical Exam  Constitutional: She is oriented to person, place, and time. She appears well-developed and well-nourished. No distress.  Obese. Nontoxic.  HENT:  Head: Normocephalic and atraumatic.  Oropharynx clear. No angioedema. Patient tolerating secretions without difficulty.  Eyes: Conjunctivae and EOM are normal. No scleral icterus.  Neck: Normal range of motion.  No stridor  Cardiovascular: Normal rate, regular rhythm and intact distal pulses.   Pulmonary/Chest: Effort normal. No respiratory distress. She has no wheezes. She has no rales.  Respirations even and unlabored. Lungs clear.  Musculoskeletal: Normal range of motion.  Neurological: She is alert and oriented to person, place, and time. She exhibits normal muscle tone. Coordination normal.  Skin: Skin is warm and dry. No rash noted. She is not diaphoretic. No erythema. No pallor.  Psychiatric: She has a normal mood and affect. Her behavior is normal.  Nursing note and vitals reviewed.  ED Treatments / Results  Labs (all labs ordered are listed, but only abnormal results are displayed) Labs Reviewed - No data to display  EKG  EKG Interpretation None       Radiology No results found.  Procedures Procedures (including critical care time)  Medications Ordered in ED Medications  famotidine (PEPCID) IVPB 20 mg premix (0 mg Intravenous Stopped 03/09/17 0524)  methylPREDNISolone sodium succinate (SOLU-MEDROL) 125 mg/2 mL injection 125 mg (125 mg Intravenous Given 03/09/17 0247)  diphenhydrAMINE (BENADRYL) injection 25 mg (25 mg Intravenous Given 03/09/17 0247)  sodium chloride 0.9 % bolus 1,000 mL (0 mLs Intravenous Stopped 03/09/17 0525)     Initial Impression  / Assessment and Plan / ED Course  I have reviewed the triage vital signs and the nursing notes.  Pertinent labs & imaging results that were available during my care of the patient were reviewed by me and considered in my medical decision making (see chart for details).     21 year old female presents to the emergency department for presumed allergic reaction. 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 Patient with reassuring physical exam. No stridor or angioedema. Lungs clear to auscultation bilaterally. No hypoxia.  On repeat assessment, patient reports improvement in symptoms with supportive management. Oropharynx remains clear. Vitals stable. Questions that symptoms may also potentially be due to anxiety. I do not believe further emergent workup or monitoring is indicated. Supportive care and advised with return if symptoms worsen. Patient discharged in stable condition with no unaddressed concerns.   Final Clinical Impressions(s) / ED Diagnoses   Final diagnoses:  Allergic reaction, initial encounter    New Prescriptions New Prescriptions   PREDNISONE (DELTASONE) 20 MG TABLET    Take 2 tablets (40 mg total) by mouth daily. Take 40 mg by mouth daily for 3 days, then 20mg  by mouth daily for 3 days, then 10mg  daily for 3 days     Antony Madura, PA-C 03/09/17 0539    Shon Baton, MD 03/09/17 [phone]

## 2017-09-30 IMAGING — CT CT HEAD W/O CM
2 series · 16 of 30 positions shown, 20 images · non-contrast
Comparison: December 10, 2013

CLINICAL DATA: Headache for 2 days

EXAM:
CT HEAD WITHOUT CONTRAST
TECHNIQUE: Contiguous axial images were obtained from the base of the skull
through the vertex without intravenous contrast.

[Series 2: head w/o · axial · non-contrast · 0.45mm/px · z∈[-125,-5]mm · 13 of 28 slices shown, 17 images]
[im 2/28  brain]
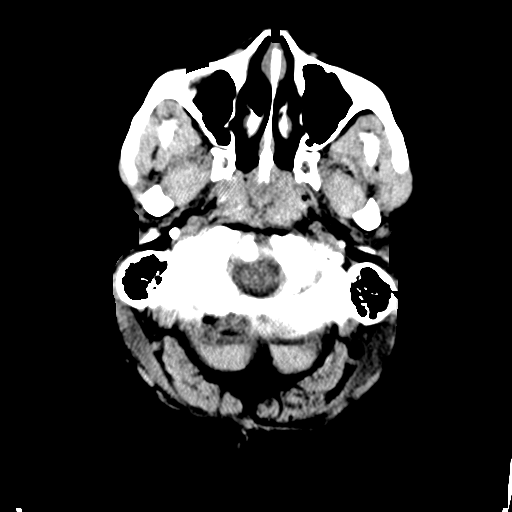
[im 2/28  bone]
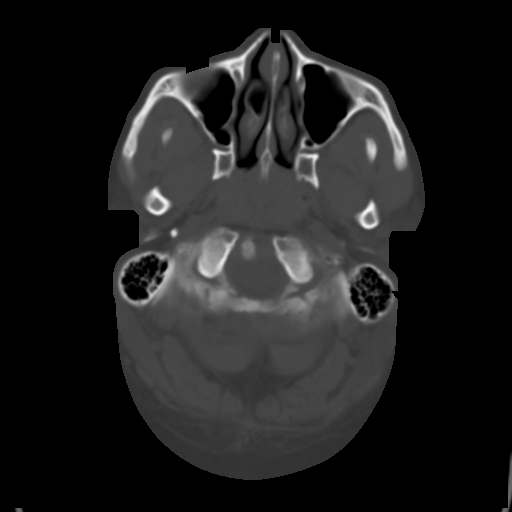
[im 4/28  brain]
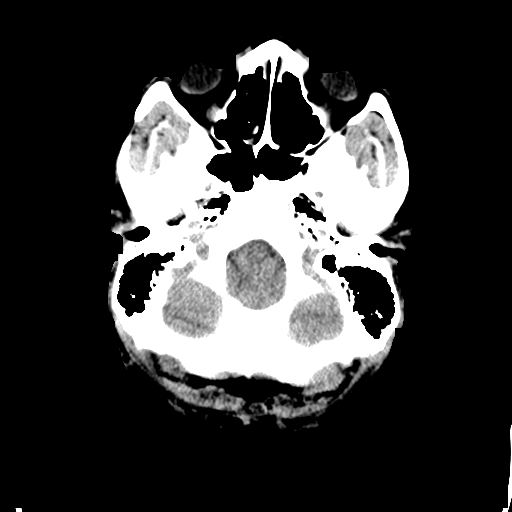
[im 6/28  brain]
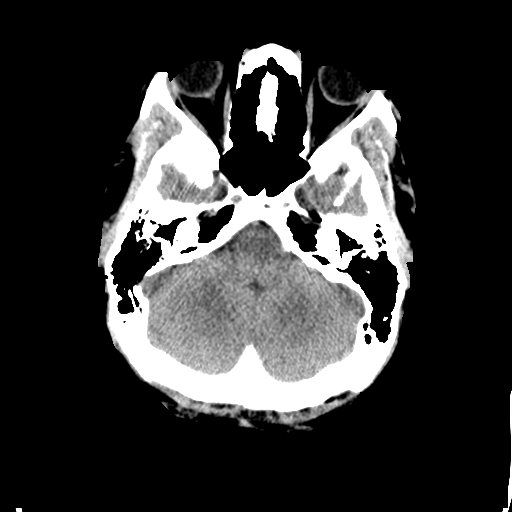
[im 8/28  brain]
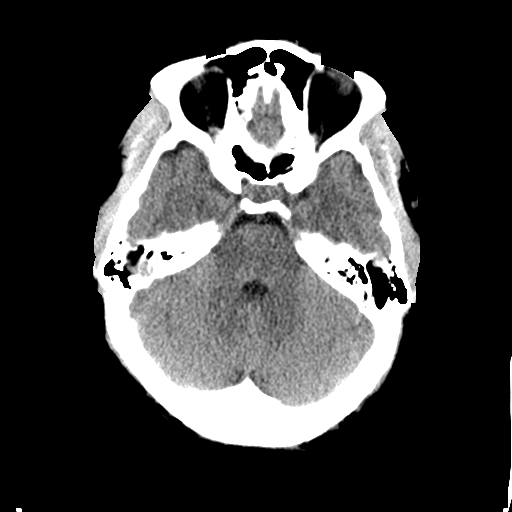
[im 10/28  brain]
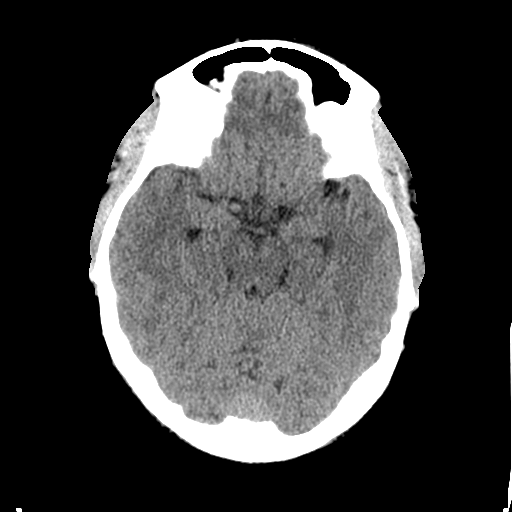
[im 10/28  bone]
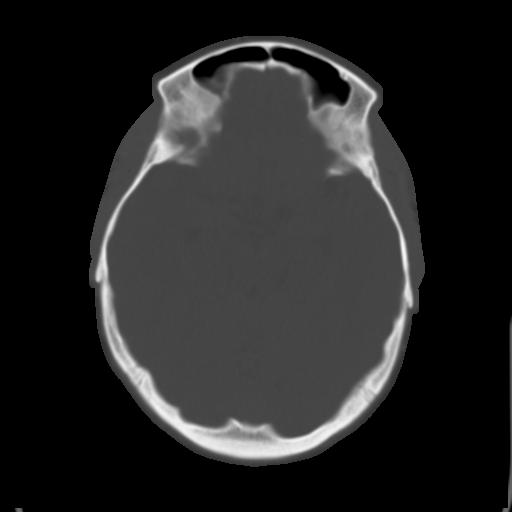
[im 12/28  brain]
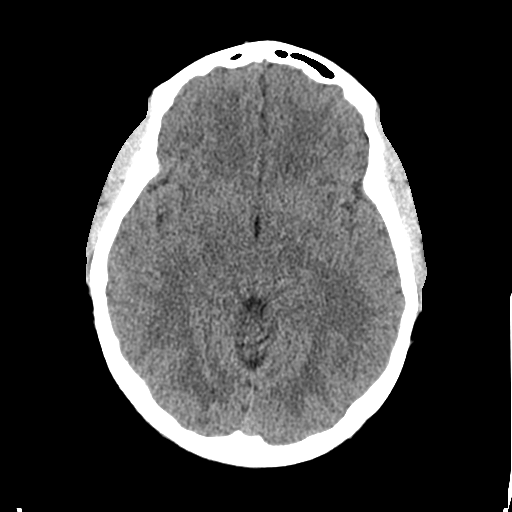
[im 14/28  brain]
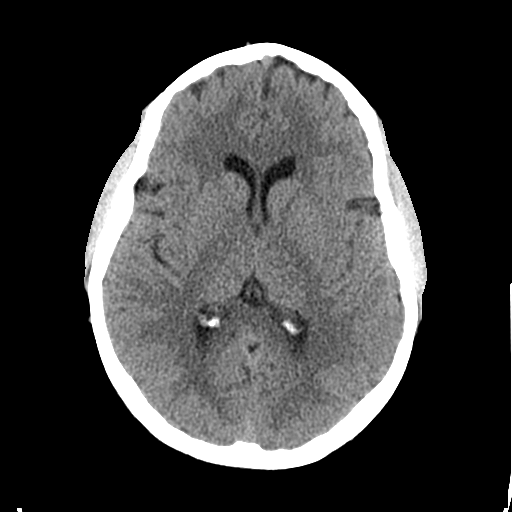
[im 16/28  brain]
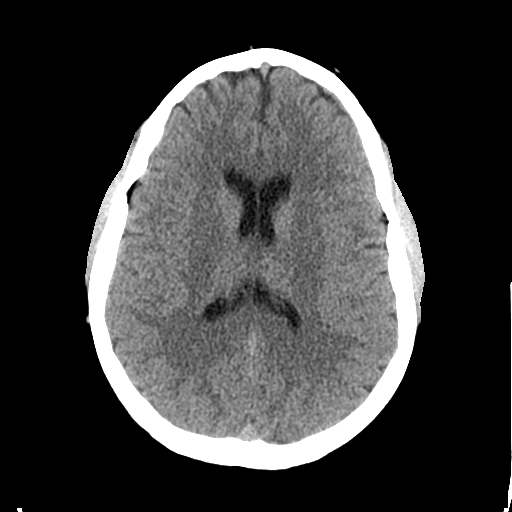
[im 18/28  brain]
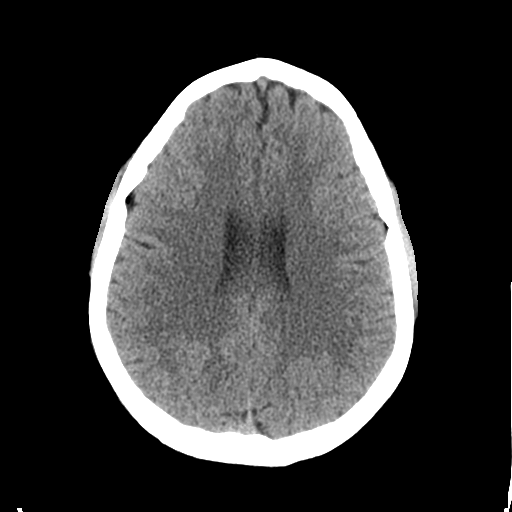
[im 18/28  bone]
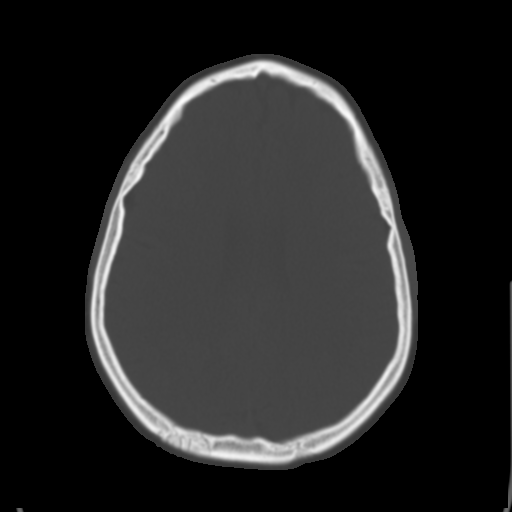
[im 20/28  brain]
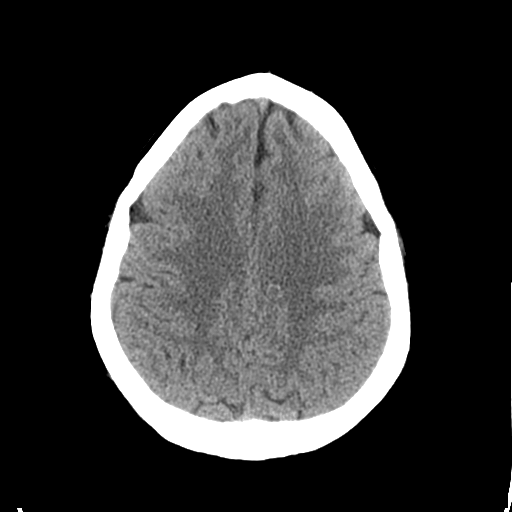
[im 22/28  brain]
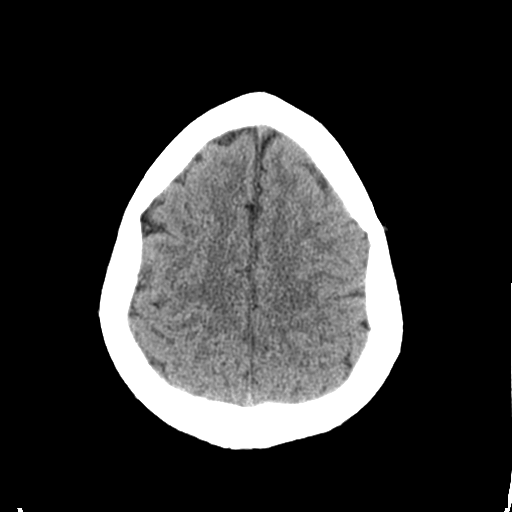
[im 24/28  brain]
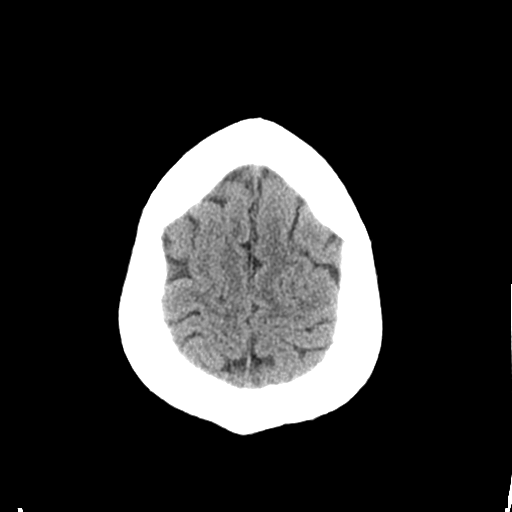
[im 26/28  brain]
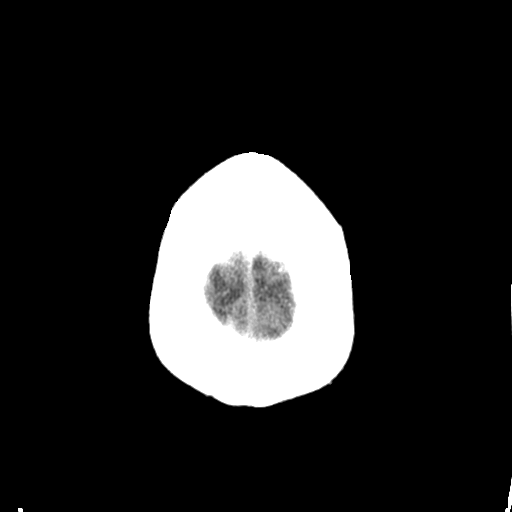
[im 26/28  bone]
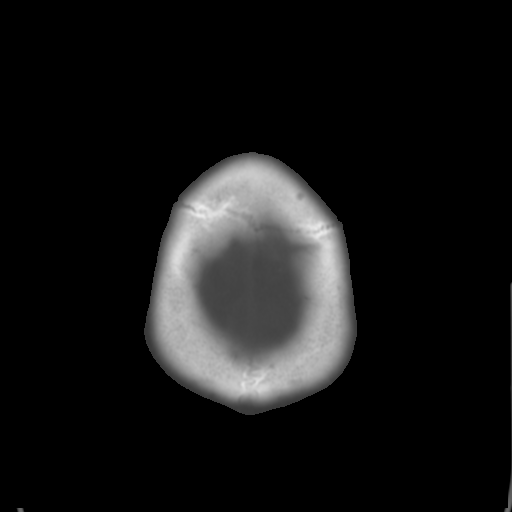

[Series 3: bone windows · axial · 0.45mm/px · z∈[-125,-85]mm · 3 of 28 slices shown]
[im 2/28  bone]
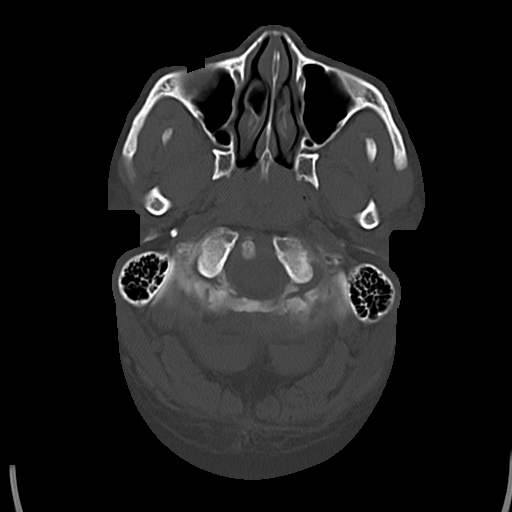
[im 6/28  bone]
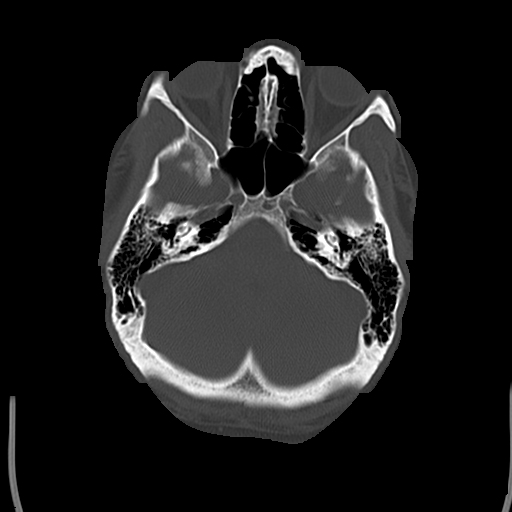
[im 10/28  bone]
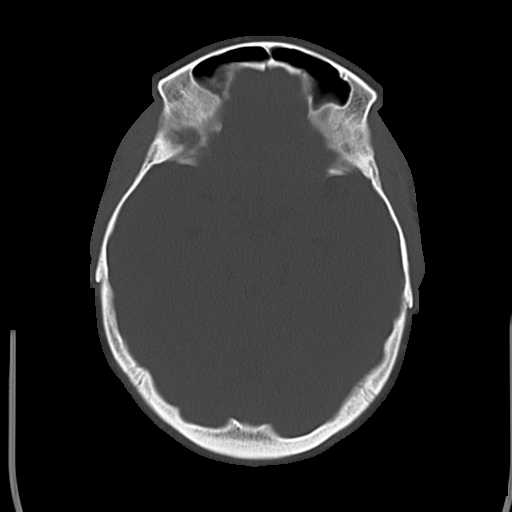

[16 of 30 positions shown; findings below may reference images not displayed]

FINDINGS: The ventricles are normal in size and configuration. There is no
intracranial mass, hemorrhage, extra-axial fluid collection, or
midline shift. Gray-white compartments are normal. No acute infarct
evident. Bony calvarium appears intact. The visualized mastoids are
clear. No intraorbital lesions are identified.
IMPRESSION: Study within normal limits.

## 2017-10-06 ENCOUNTER — Other Ambulatory Visit: Payer: Self-pay

## 2017-10-06 ENCOUNTER — Encounter (HOSPITAL_COMMUNITY): Payer: Self-pay | Admitting: *Deleted

## 2017-10-06 DIAGNOSIS — R05 Cough: Secondary | ICD-10-CM | POA: Insufficient documentation

## 2017-10-06 DIAGNOSIS — Z79899 Other long term (current) drug therapy: Secondary | ICD-10-CM | POA: Insufficient documentation

## 2017-10-06 DIAGNOSIS — J069 Acute upper respiratory infection, unspecified: Secondary | ICD-10-CM | POA: Diagnosis not present

## 2017-10-06 DIAGNOSIS — J029 Acute pharyngitis, unspecified: Secondary | ICD-10-CM | POA: Diagnosis present

## 2017-10-06 NOTE — ED Triage Notes (Signed)
Pt with upper resp symptoms, cough, sore throat, phelgm, has tried OTC meds without relief.

## 2017-10-07 ENCOUNTER — Emergency Department (HOSPITAL_COMMUNITY)
Admission: EM | Admit: 2017-10-07 | Discharge: 2017-10-07 | Disposition: A | Discharge status: Home / Self Care | Payer: Managed Care, Other (non HMO) | Attending: Emergency Medicine | Admitting: Emergency Medicine

## 2017-10-07 ENCOUNTER — Emergency Department (HOSPITAL_COMMUNITY): Payer: Managed Care, Other (non HMO)

## 2017-10-07 DIAGNOSIS — R059 Cough, unspecified: Secondary | ICD-10-CM

## 2017-10-07 DIAGNOSIS — J069 Acute upper respiratory infection, unspecified: Secondary | ICD-10-CM

## 2017-10-07 DIAGNOSIS — R05 Cough: Secondary | ICD-10-CM

## 2017-10-07 DIAGNOSIS — B9789 Other viral agents as the cause of diseases classified elsewhere: Secondary | ICD-10-CM

## 2017-10-07 MED ORDER — ALBUTEROL SULFATE HFA 108 (90 BASE) MCG/ACT IN AERS: 2.0000 | INHALATION_SPRAY | RESPIRATORY_TRACT | 0 refills | Status: DC | PRN

## 2017-10-07 MED ORDER — ALBUTEROL SULFATE HFA 108 (90 BASE) MCG/ACT IN AERS
2.0000 | INHALATION_SPRAY | RESPIRATORY_TRACT | Status: DC | PRN
  Administered 2017-10-07: 2 via RESPIRATORY_TRACT
  Filled 2017-10-07: qty 6.7

## 2017-10-07 MED ORDER — IBUPROFEN 800 MG PO TABS
800.0000 mg | ORAL_TABLET | Freq: Once | ORAL | Status: AC
  Administered 2017-10-07: 800 mg via ORAL
  Filled 2017-10-07: qty 1

## 2017-10-07 MED ORDER — AEROCHAMBER PLUS FLO-VU MEDIUM MISC
1.0000 | Freq: Once | Status: AC
  Administered 2017-10-07: 1
  Filled 2017-10-07: qty 1

## 2017-10-07 MED ORDER — BENZONATATE 200 MG PO CAPS: 200.0000 mg | ORAL_CAPSULE | Freq: Three times a day (TID) | ORAL | 0 refills | Status: AC | PRN

## 2017-10-07 MED ORDER — FLUTICASONE PROPIONATE 50 MCG/ACT NA SUSP: 2.0000 | Freq: Every day | NASAL | 0 refills | Status: DC

## 2017-10-07 NOTE — ED Provider Notes (Signed)
[facility]-EMERGENCY DEPT Provider Note   CSN: [id number] Arrival date & time: 10/06/17  2256     History   Chief Complaint Chief Complaint  Patient presents with  . URI    HPI Kathryn Peters is a 21 y.o. female with a hx of DiGeorge syndrome presents to the Emergency Department complaining of gradual, persistent, progressively worsening URI symptoms onset approx 3 days ago.  Mother reports everyone in the house is sick with something similar.  Pt reports associated symptoms include sore throat, nasal congestion, post nasal drip, cough and "pain in her tongue."  Pt has tried several OTC treatments without relief.  Nothing seems to make her symptoms better or worse.  Pt denies fever, chills, headache, neck pain, chest pain, SOB, abd pain, N/V/D, syncope, urinary symptoms.     The history is provided by the patient, a parent and medical records. No language interpreter was used.    Past Medical History:  Diagnosis Date  . Di George's syndrome (HCC)     There are no active problems to display for this patient.   Past Surgical History:  Procedure Laterality Date  . TONSILLECTOMY      OB History    Gravida Para Term Preterm AB Living   1             SAB TAB Ectopic Multiple Live Births                   Home Medications    Prior to Admission medications   Medication Sig Start Date End Date Taking? Authorizing Provider  albuterol (PROVENTIL HFA;VENTOLIN HFA) 108 (90 Base) MCG/ACT inhaler Inhale 2 puffs into the lungs every 4 (four) hours as needed for wheezing or shortness of breath. 10/07/17 11/06/17  Shawnae Leiva, Dahlia Client, PA-C  benzonatate (TESSALON) 200 MG capsule Take 1 capsule (200 mg total) by mouth 3 (three) times daily as needed for up to 7 days for cough. 10/07/17 10/14/17  Brietta Manso, Dahlia Client, PA-C  fexofenadine (ALLEGRA) 60 MG tablet Take 60 mg by mouth daily.    [provider]  fluticasone (FLONASE) 50 MCG/ACT nasal spray  Place 2 sprays into both nostrils daily. 10/07/17   Hadi Dubin, Dahlia Client, PA-C  ibuprofen (ADVIL,MOTRIN) 200 MG tablet Take 200 mg by mouth every 6 (six) hours as needed for moderate pain.    [provider]  predniSONE (DELTASONE) 20 MG tablet Take 2 tablets (40 mg total) by mouth daily. Take 40 mg by mouth daily for 3 days, then 20mg  by mouth daily for 3 days, then 10mg  daily for 3 days 03/09/17   Antony Madura, PA-C    Family History Family History  Problem Relation Age of Onset  . Hypertension Father     Social History Social History   Tobacco Use  . Smoking status: Never Smoker  . Smokeless tobacco: Never Used  Substance Use Topics  . Alcohol use: No  . Drug use: No     Allergies   Food and Pineapple   Review of Systems Review of Systems  Constitutional: Positive for fatigue. Negative for appetite change, chills and fever.  HENT: Positive for congestion, postnasal drip, rhinorrhea, sinus pressure and sore throat. Negative for ear discharge, ear pain and mouth sores.   Eyes: Negative for visual disturbance.  Respiratory: Positive for cough, shortness of breath and wheezing. Negative for stridor.   Cardiovascular: Negative for chest pain, palpitations and leg swelling.  Gastrointestinal: Negative for abdominal pain, diarrhea, nausea and vomiting.  Genitourinary: Negative for dysuria, frequency, hematuria and urgency.  Musculoskeletal: Negative for arthralgias, back pain, myalgias and neck stiffness.  Skin: Negative for rash.  Neurological: Negative for syncope, light-headedness, numbness and headaches.  Hematological: Negative for adenopathy.  Psychiatric/Behavioral: The patient is not nervous/anxious.   All other systems reviewed and are negative.    Physical Exam Updated Vital Signs BP 118/81 (BP Location: Right Arm)   Pulse 87   Temp 99.7 F (37.6 C) (Oral)   Resp 18   Ht 5\' 5"  (1.651 m)   Wt 90.7 kg (200 lb)   LMP 10/04/2017   SpO2 98%    Breastfeeding? Unknown   BMI 33.28 kg/m   Physical Exam  Constitutional: She appears well-developed and well-nourished. No distress.  HENT:  Head: Normocephalic and atraumatic.  Right Ear: Tympanic membrane, external ear and ear canal normal.  Left Ear: Tympanic membrane, external ear and ear canal normal.  Nose: Mucosal edema and rhinorrhea present. No epistaxis. Right sinus exhibits no maxillary sinus tenderness and no frontal sinus tenderness. Left sinus exhibits no maxillary sinus tenderness and no frontal sinus tenderness.  Mouth/Throat: Uvula is midline and mucous membranes are normal. Mucous membranes are not pale and not cyanotic. No oropharyngeal exudate, posterior oropharyngeal edema, posterior oropharyngeal erythema or tonsillar abscesses.  Aphthous ulcer to the right side of the tongue  Eyes: Conjunctivae are normal. Pupils are equal, round, and reactive to light.  Neck: Normal range of motion and full passive range of motion without pain.  Cardiovascular: Normal rate and intact distal pulses.  Pulmonary/Chest: Effort normal. No accessory muscle usage or stridor. No tachypnea. She has decreased breath sounds.  Globally diminished breath sounds without focal wheezes, rhonchi, rales Congested cough  Abdominal: Soft. There is no tenderness.  Musculoskeletal: Normal range of motion.  Lymphadenopathy:    She has no cervical adenopathy.  Neurological: She is alert.  Skin: Skin is warm and dry. No rash noted. She is not diaphoretic.  Psychiatric: She has a normal mood and affect.  Nursing note and vitals reviewed.    ED Treatments / Results   Radiology Dg Chest 2 View  Result Date: 10/07/2017 CLINICAL DATA:  21 year old female with cough and congestion and fever. EXAM: CHEST  2 VIEW COMPARISON:  Chest radiograph dated 07/08/2016 FINDINGS: The heart size and mediastinal contours are within normal limits. Both lungs are clear. The visualized skeletal structures are  unremarkable. IMPRESSION: No active cardiopulmonary disease. Electronically Signed   By: Elgie CollardArash  Radparvar M.D.   On: 10/07/2017 02:47    Procedures Procedures (including critical care time)  Medications Ordered in ED Medications  ibuprofen (ADVIL,MOTRIN) tablet 800 mg (not administered)  albuterol (PROVENTIL HFA;VENTOLIN HFA) 108 (90 Base) MCG/ACT inhaler 2 puff (not administered)  AEROCHAMBER PLUS FLO-VU MEDIUM MISC 1 each (not administered)     Initial Impression / Assessment and Plan / ED Course  I have reviewed the triage vital signs and the nursing notes.  Pertinent labs & imaging results that were available during my care of the patient were reviewed by me and considered in my medical decision making (see chart for details).  Clinical Course as of Oct 07 305  Fri Oct 07, 2017  0302 Pt tachycardic at triage, but no longer tachycardic upon my evaluation of her Pulse Rate: (!) 103 [HM]    Clinical Course User Index [HM] Pete Schnitzer, Dahlia ClientHannah, PA-C    Pt CXR negative for acute infiltrate. Patients symptoms are consistent with URI, likely viral etiology. Discussed that antibiotics  are not indicated for viral infections. Pt will be discharged with symptomatic treatment.  Verbalizes understanding and is agreeable with plan. Pt is hemodynamically stable & in NAD prior to dc.   Final Clinical Impressions(s) / ED Diagnoses   Final diagnoses:  Viral URI with cough  Cough    ED Discharge Orders        Ordered    benzonatate (TESSALON) 200 MG capsule  3 times daily PRN     10/07/17 0305    albuterol (PROVENTIL HFA;VENTOLIN HFA) 108 (90 Base) MCG/ACT inhaler  Every 4 hours PRN     10/07/17 0305    fluticasone (FLONASE) 50 MCG/ACT nasal spray  Daily     10/07/17 0305       Avis Mcmahill, Dahlia ClientHannah, PA-C 10/07/17 0306    Little, Ambrose Finlandachel Morgan, MD 10/10/17 [phone]

## 2017-10-07 NOTE — Discharge Instructions (Signed)
1. Medications: flonase, mucinex, tessalon, albuterol, usual home medications 2. Treatment: rest, drink plenty of fluids, take tylenol or ibuprofen for fever control, swish with [place] cold sore mouth wash 3. Follow Up: Please followup with your primary doctor in 5-7 days for discussion of your diagnoses and further evaluation after today's visit; if you do not have a primary care doctor use the resource guide provided to find one; Return to the ER for high fevers, difficulty breathing or other concerning symptoms

## 2017-12-13 IMAGING — CR DG CHEST 2V
2 series · 2 of 2 positions shown · non-contrast
Comparison: Chest radiograph performed 03/07/2014

CLINICAL DATA: Acute onset of dyspnea and cough. Fever. Initial
encounter.

EXAM:
CHEST  2 VIEW

[w chest pa]
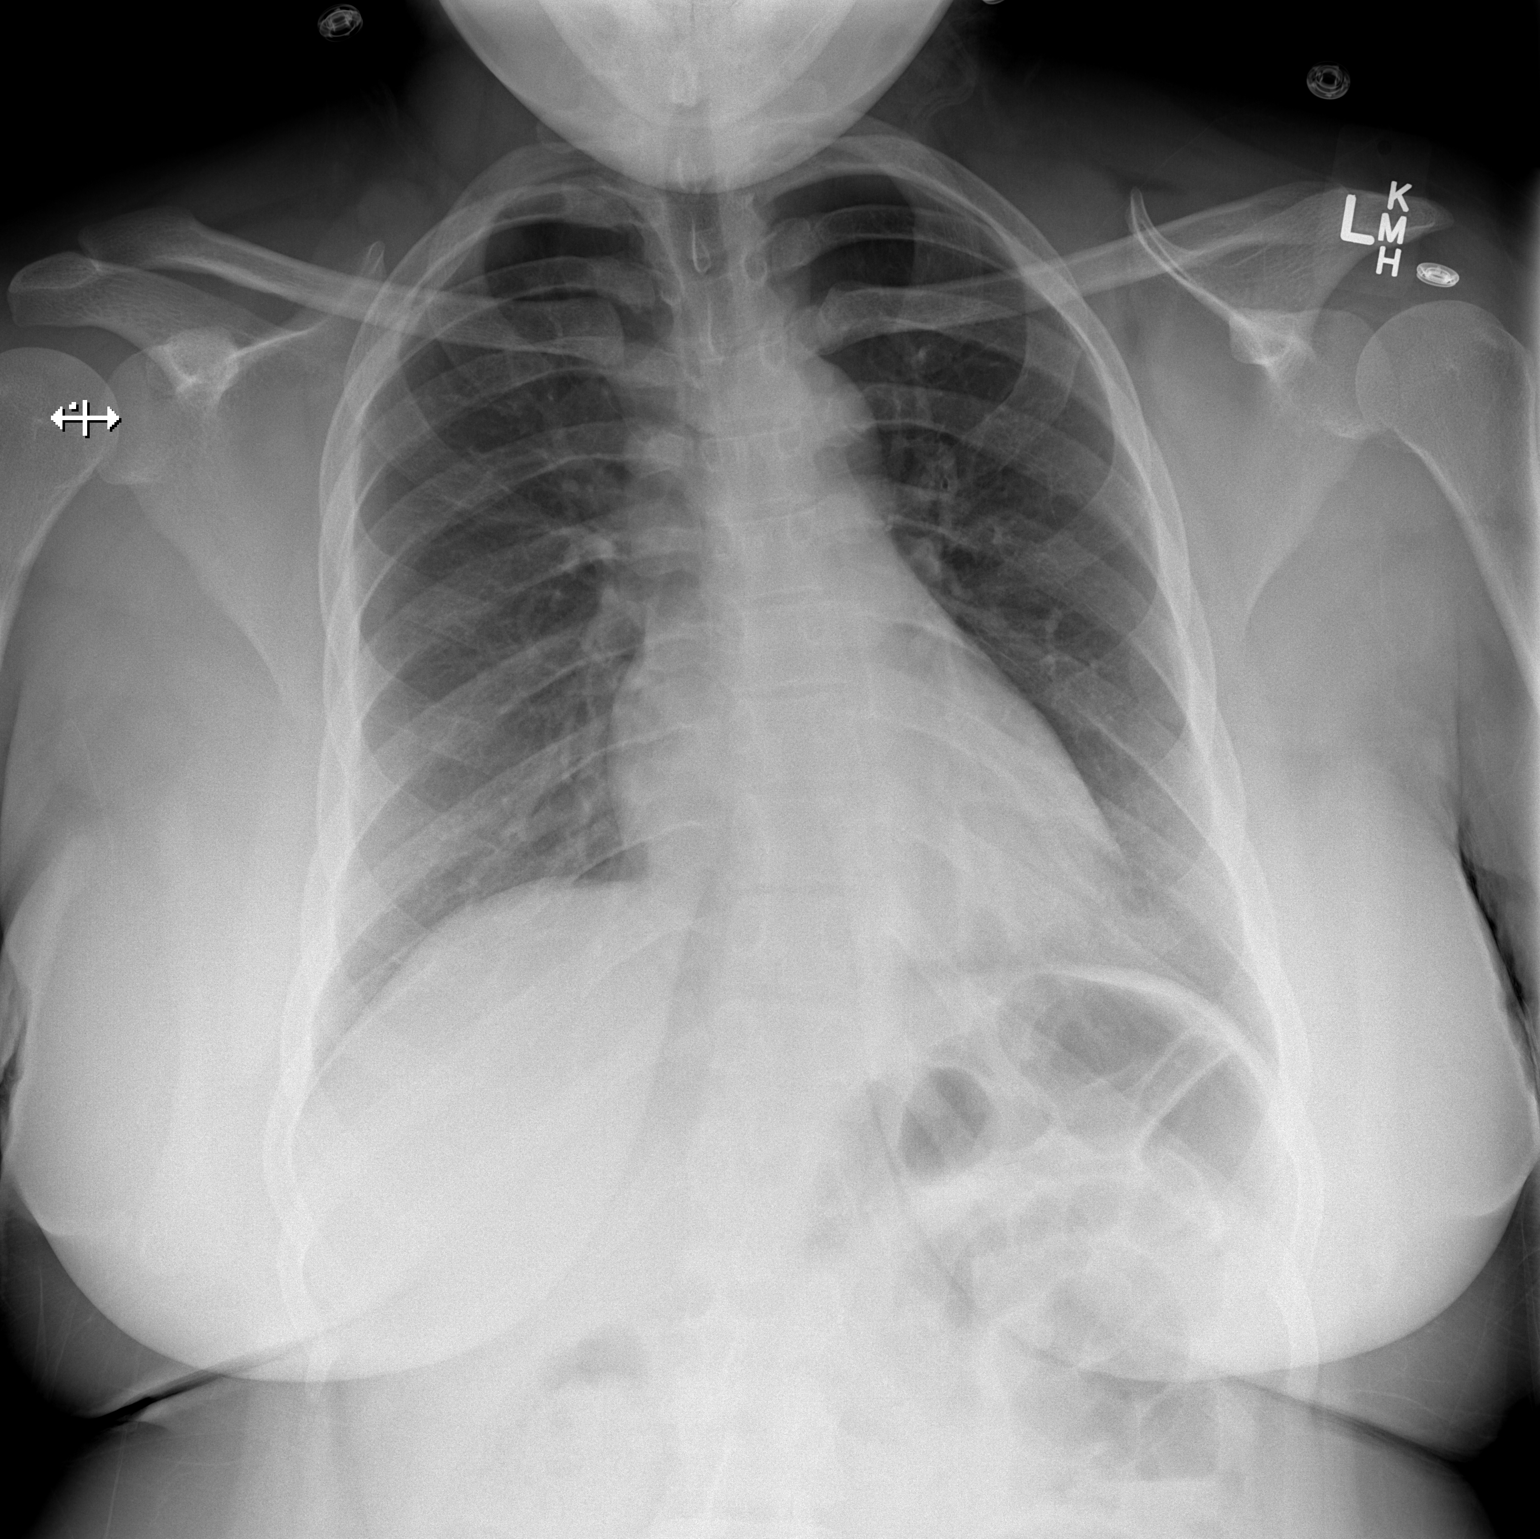

[w chest lat]
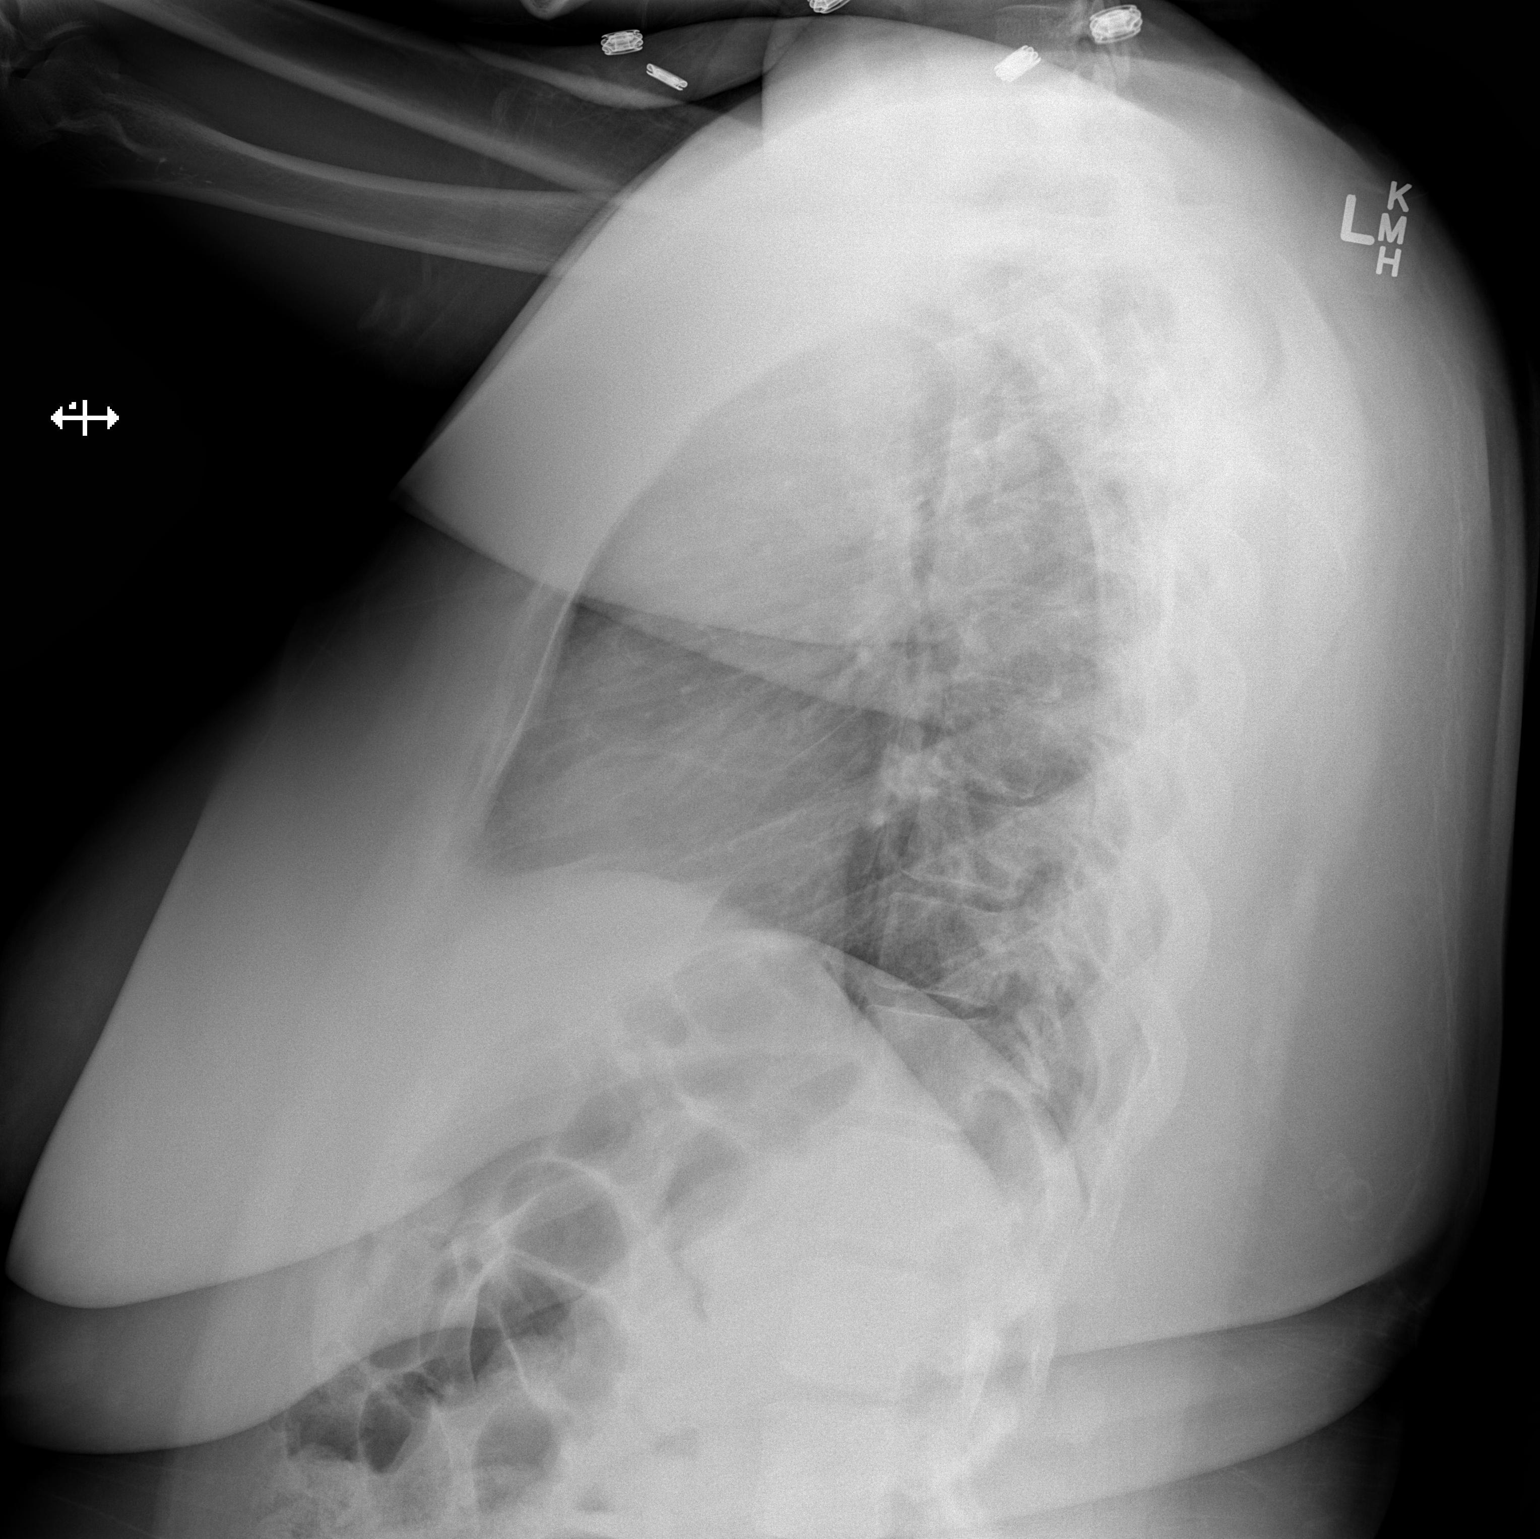

[2 of 2 positions shown; findings below may reference images not displayed]

FINDINGS: The lungs are well-aerated and clear. There is no evidence of focal
opacification, pleural effusion or pneumothorax.

The heart is borderline normal in size. No acute osseous
abnormalities are seen.
IMPRESSION: No acute cardiopulmonary process seen.

## 2018-02-01 ENCOUNTER — Ambulatory Visit
Admission: RE | Admit: 2018-02-01 | Discharge: 2018-02-01 | Disposition: A | Discharge status: Home / Self Care | Payer: Managed Care, Other (non HMO) | Source: Ambulatory Visit | Attending: Family Medicine | Admitting: Family Medicine

## 2018-02-01 ENCOUNTER — Other Ambulatory Visit: Payer: Self-pay | Admitting: Family Medicine

## 2018-02-01 DIAGNOSIS — M130 Polyarthritis, unspecified: Secondary | ICD-10-CM

## 2018-09-01 ENCOUNTER — Other Ambulatory Visit: Payer: Self-pay | Admitting: Family Medicine

## 2018-09-01 ENCOUNTER — Ambulatory Visit
Admission: RE | Admit: 2018-09-01 | Discharge: 2018-09-01 | Disposition: A | Discharge status: Home / Self Care | Payer: Self-pay | Source: Ambulatory Visit | Attending: Family Medicine | Admitting: Family Medicine

## 2018-09-01 DIAGNOSIS — L0390 Cellulitis, unspecified: Secondary | ICD-10-CM

## 2019-04-29 ENCOUNTER — Other Ambulatory Visit: Payer: Self-pay

## 2019-04-29 ENCOUNTER — Emergency Department (HOSPITAL_BASED_OUTPATIENT_CLINIC_OR_DEPARTMENT_OTHER)
Admission: EM | Admit: 2019-04-29 | Discharge: 2019-04-29 | Disposition: A | Discharge status: Home / Self Care | Payer: Medicaid Other | Attending: Emergency Medicine | Admitting: Emergency Medicine

## 2019-04-29 ENCOUNTER — Encounter (HOSPITAL_BASED_OUTPATIENT_CLINIC_OR_DEPARTMENT_OTHER): Payer: Self-pay | Admitting: Emergency Medicine

## 2019-04-29 DIAGNOSIS — Y939 Activity, unspecified: Secondary | ICD-10-CM | POA: Diagnosis not present

## 2019-04-29 DIAGNOSIS — W260XXA Contact with knife, initial encounter: Secondary | ICD-10-CM | POA: Diagnosis not present

## 2019-04-29 DIAGNOSIS — Z23 Encounter for immunization: Secondary | ICD-10-CM | POA: Insufficient documentation

## 2019-04-29 DIAGNOSIS — Y999 Unspecified external cause status: Secondary | ICD-10-CM | POA: Insufficient documentation

## 2019-04-29 DIAGNOSIS — S61012A Laceration without foreign body of left thumb without damage to nail, initial encounter: Secondary | ICD-10-CM | POA: Diagnosis not present

## 2019-04-29 DIAGNOSIS — S61213A Laceration without foreign body of left middle finger without damage to nail, initial encounter: Secondary | ICD-10-CM | POA: Insufficient documentation

## 2019-04-29 DIAGNOSIS — Y929 Unspecified place or not applicable: Secondary | ICD-10-CM | POA: Diagnosis not present

## 2019-04-29 MED ORDER — TETANUS-DIPHTH-ACELL PERTUSSIS 5-2.5-18.5 LF-MCG/0.5 IM SUSP
0.5000 mL | Freq: Once | INTRAMUSCULAR | Status: AC
  Administered 2019-04-29: 0.5 mL via INTRAMUSCULAR
  Filled 2019-04-29: qty 0.5

## 2019-04-29 MED ORDER — BACITRACIN ZINC 500 UNIT/GM EX OINT: TOPICAL_OINTMENT | Freq: Two times a day (BID) | CUTANEOUS | Status: DC

## 2019-04-29 MED ORDER — LIDOCAINE HCL (PF) 1 % IJ SOLN
10.0000 mL | Freq: Once | INTRAMUSCULAR | Status: AC
  Administered 2019-04-29: 14:00:00 10 mL
  Filled 2019-04-29: qty 10

## 2019-04-29 NOTE — ED Triage Notes (Signed)
Laceration to L middle finger and thumb from a kitchen knife.

## 2019-04-29 NOTE — Discharge Instructions (Addendum)
Keep wounds clean and dry. Wound check with your doctor in 2 days, suture removal in 10 days.

## 2019-04-29 NOTE — ED Provider Notes (Signed)
[facility] EMERGENCY DEPARTMENT Provider Note   CSN: [id number] Arrival date & time: 04/29/19  1328    History   Chief Complaint Chief Complaint  Patient presents with   Finger Injury    HPI Kathryn Peters is a 23 y.o. female.     23yo female presents with complaint of lacerations to her left thumb and third finger, accidentally cut her fingers with a paring knife. Bleeding controlled prior to arrival. Last tdap unknown. Right hand dominant.      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   Past Surgical History:  Procedure Laterality Date   TONSILLECTOMY       OB History    Gravida  1   Para      Term      Preterm      AB      Living        SAB      TAB      Ectopic      Multiple      Live Births               Home Medications    Prior to Admission medications   Medication Sig Start Date End Date Taking? Authorizing Provider  albuterol (PROVENTIL HFA;VENTOLIN HFA) 108 (90 Base) MCG/ACT inhaler Inhale 2 puffs into the lungs every 4 (four) hours as needed for wheezing or shortness of breath. 10/07/17 11/06/17  Muthersbaugh, Dahlia ClientHannah, PA-C  fexofenadine (ALLEGRA) 60 MG tablet Take 60 mg by mouth daily.    [provider]  ibuprofen (ADVIL,MOTRIN) 200 MG tablet Take 200 mg by mouth every 6 (six) hours as needed for moderate pain.    [provider]    Family History Family History  Problem Relation Age of Onset   Hypertension Father     Social History Social History   Tobacco Use   Smoking status: Never Smoker   Smokeless tobacco: Never Used  Substance Use Topics   Alcohol use: No   Drug use: No     Allergies   Food and Pineapple   Review of Systems Review of Systems  Constitutional: Negative for fever.  Musculoskeletal: Positive for myalgias.  Skin: Positive for wound.  Allergic/Immunologic:  Positive for immunocompromised state.  Neurological: Negative for weakness and numbness.  Hematological: Does not bruise/bleed easily.  Psychiatric/Behavioral: Negative for self-injury.  All other systems reviewed and are negative.    Physical Exam Updated Vital Signs BP (!) 147/94 (BP Location: Right Arm)    Pulse 96    Temp 98.2 F (36.8 C) (Oral)    Resp 18    LMP 03/30/2019    SpO2 99%   Physical Exam Vitals signs and nursing note reviewed.  Constitutional:      General: She is not in acute distress.    Appearance: She is well-developed. She is not diaphoretic.  HENT:     Head: Normocephalic and atraumatic.  Cardiovascular:     Pulses: Normal pulses.  Pulmonary:     Effort: Pulmonary effort is normal.  Musculoskeletal:        General: Swelling, tenderness and signs of injury present. No deformity.       Hands:  Skin:    General: Skin is warm and dry.     Capillary Refill: Capillary refill takes less than 2 seconds.     Findings: No  erythema or rash.  Neurological:     Mental Status: She is alert and oriented to person, place, and time.     Sensory: No sensory deficit.     Motor: No weakness.  Psychiatric:        Behavior: Behavior normal.      ED Treatments / Results  Labs (all labs ordered are listed, but only abnormal results are displayed) Labs Reviewed - No data to display  EKG None  Radiology No results found.  Procedures .Marland Kitchen.Laceration Repair  Date/Time: 04/29/2019 3:38 PM Performed by: Jeannie FendMurphy, Murrel Bertram A, PA-C Authorized by: Jeannie FendMurphy, Analy Bassford A, PA-C   Consent:    Consent obtained:  Verbal   Consent given by:  Patient   Risks discussed:  Infection, need for additional repair, pain, poor cosmetic result and poor wound healing   Alternatives discussed:  No treatment and delayed treatment Universal protocol:    Procedure explained and questions answered to patient or proxy's satisfaction: yes     Relevant documents present and verified: yes     Test  results available and properly labeled: yes     Imaging studies available: yes     Required blood products, implants, devices, and special equipment available: yes     Site/side marked: yes     Immediately prior to procedure, a time out was called: yes     Patient identity confirmed:  Verbally with patient Anesthesia (see MAR for exact dosages):    Anesthesia method:  Nerve block and local infiltration   Local anesthetic:  Lidocaine 1% w/o epi   Block location:  Digital block   Block needle gauge:  25 G   Block anesthetic:  Lidocaine 1% w/o epi   Block technique:  Digital block   Block injection procedure:  Negative aspiration for blood, introduced needle, incremental injection, anatomic landmarks identified and anatomic landmarks palpated   Block outcome:  Incomplete block Laceration details:    Location:  Finger   Finger location:  L long finger   Length (cm):  2   Depth (mm):  3 Repair type:    Repair type:  Simple Pre-procedure details:    Preparation:  Patient was prepped and draped in usual sterile fashion Exploration:    Hemostasis achieved with:  Tourniquet   Wound exploration: wound explored through full range of motion and entire depth of wound probed and visualized     Wound extent: no foreign bodies/material noted, no muscle damage noted and no tendon damage noted     Contaminated: no   Treatment:    Area cleansed with:  Saline   Amount of cleaning:  Standard   Irrigation solution:  Sterile saline Skin repair:    Repair method:  Sutures   Suture size:  4-0   Suture material:  Nylon   Suture technique:  Simple interrupted   Number of sutures:  3 Approximation:    Approximation:  Close Post-procedure details:    Dressing:  Splint for protection and antibiotic ointment   Patient tolerance of procedure:  Tolerated well, no immediate complications .Marland Kitchen.Laceration Repair  Date/Time: 04/29/2019 3:41 PM Performed by: Jeannie FendMurphy, Allisa Einspahr A, PA-C Authorized by: Jeannie FendMurphy, Madgie Dhaliwal  A, PA-C   Consent:    Consent obtained:  Verbal   Consent given by:  Patient   Risks discussed:  Infection, need for additional repair, pain, poor cosmetic result and poor wound healing   Alternatives discussed:  No treatment and delayed treatment Universal protocol:    Procedure explained and questions  answered to patient or proxy's satisfaction: yes     Relevant documents present and verified: yes     Test results available and properly labeled: yes     Imaging studies available: yes     Required blood products, implants, devices, and special equipment available: yes     Site/side marked: yes     Immediately prior to procedure, a time out was called: yes     Patient identity confirmed:  Verbally with patient Anesthesia (see MAR for exact dosages):    Anesthesia method:  Local infiltration and nerve block   Local anesthetic:  Lidocaine 1% w/o epi   Block location:  Digital block   Block needle gauge:  25 G   Block anesthetic:  Lidocaine 1% w/o epi   Block technique:  Digital block   Block injection procedure:  Anatomic landmarks identified, anatomic landmarks palpated, negative aspiration for blood, incremental injection and introduced needle   Block outcome:  Incomplete block Laceration details:    Location:  Finger   Finger location:  L thumb   Length (cm):  1.5   Depth (mm):  2 Repair type:    Repair type:  Simple Pre-procedure details:    Preparation:  Patient was prepped and draped in usual sterile fashion Exploration:    Hemostasis achieved with:  Direct pressure   Wound exploration: wound explored through full range of motion and entire depth of wound probed and visualized     Wound extent: no foreign bodies/material noted, no muscle damage noted and no tendon damage noted     Contaminated: no   Treatment:    Area cleansed with:  Saline   Amount of cleaning:  Standard   Irrigation solution:  Sterile saline Skin repair:    Repair method:  Sutures   Suture size:   4-0   Suture material:  Nylon   Suture technique:  Simple interrupted   Number of sutures:  2 Approximation:    Approximation:  Close Post-procedure details:    Dressing:  Bulky dressing and antibiotic ointment   Patient tolerance of procedure:  Tolerated well, no immediate complications   (including critical care time)  Medications Ordered in ED Medications  bacitracin ointment ( Topical Not Given 04/29/19 1555)  lidocaine (PF) (XYLOCAINE) 1 % injection 10 mL (10 mLs Infiltration Given by Other 04/29/19 1414)  Tdap (BOOSTRIX) injection 0.5 mL (0.5 mLs Intramuscular Given 04/29/19 1404)     Initial Impression / Assessment and Plan / ED Course  I have reviewed the triage vital signs and the nursing notes.  Pertinent labs & imaging results that were available during my care of the patient were reviewed by me and considered in my medical decision making (see chart for details).  Clinical Course as of Apr 29 1599  Sun Apr 28, 9045  1843 23 year old female with accidental laceration to her left thumb and third finger with a kitchen knife.  Bleeding is controlled upon arrival.  Tetanus updated today.  Was anesthetized with a digital block, incomplete block additional lidocaine ministered. Wounds closed by PA student under my supervision. Wounds visualized through full range of motion, no obvious tendon or ligament injury. Patient to recheck with PCP in 2 days, suture removal in 7-10 days.    [LM]    Clinical Course User Index [LM] Tacy Learn, PA-C      Final Clinical Impressions(s) / ED Diagnoses   Final diagnoses:  Laceration of left thumb without foreign body without damage to nail,  initial encounter  Laceration of left middle finger without foreign body without damage to nail, initial encounter    ED Discharge Orders    None       Alden HippMurphy, Aidyn Sportsman A, PA-C 04/29/19 1601    Benjiman CorePickering, Nathan, MD 05/01/19 0700

## 2019-04-29 NOTE — ED Notes (Signed)
Stitches to left thumb and left middle finger done by EDP.  Patient tolerated it well.

## 2019-04-29 NOTE — ED Notes (Signed)
Laceration to left thumb measures 1.8 cm; left middle finger measures 1.2 cm.

## 2019-04-29 NOTE — ED Notes (Signed)
Pt verbalized understanding of dc instructions.

## 2019-07-06 ENCOUNTER — Telehealth: Payer: Self-pay | Admitting: Hematology and Oncology

## 2019-07-06 NOTE — Telephone Encounter (Signed)
Received a new patient referral from Dr. Fransico Setters office for a dx of thrombocytopenia. Ms. Kathryn Peters has been cld and scheduled to see Dr. Lindi Adie on 10/19 at 345pm. Aware to arrive 15 minutes early.

## 2019-07-29 NOTE — Progress Notes (Signed)
[place] NOTE  Patient Care Team: Lucianne Lei, MD as PCP - General (Family Medicine)  CHIEF COMPLAINTS/PURPOSE OF CONSULTATION:  Newly diagnosed thombocytopenia  HISTORY OF PRESENTING ILLNESS:  Kathryn Peters 23 y.o. female is here because of recent diagnosis of thrombocytopenia. She is referred by her PCP Dr. Criss Rosales. Labs on 05/26/19 showed: platelets 84,000, Hg 12.0, HCT 37.2, MCV 84.5, WBC 8,200. She presents to the clinic today for initial evaluation and discussion of treatment options.  She reports that she has had bleeding after she cut her hand with a knife as she was opening some packets.  She has also noted excessive menstrual cycle bleeds.  She has never been pregnant and has not had any major prior surgeries.  She does not have any family history also of bleeding problems.  She does not smoke or drink alcohol or use recreational drugs.  I reviewed her records extensively and collaborated the history with the patient.  MEDICAL HISTORY:  Past Medical History:  Diagnosis Date  . Di George's syndrome [facility])     SURGICAL HISTORY: Past Surgical History:  Procedure Laterality Date  . TONSILLECTOMY      SOCIAL HISTORY: Social History   Socioeconomic History  . Marital status: Single    Spouse name: Not on file  . Number of children: Not on file  . Years of education: Not on file  . Highest education level: Not on file  Occupational History  . Not on file  Social Needs  . Financial resource strain: Not on file  . Food insecurity    Worry: Not on file    Inability: Not on file  . Transportation needs    Medical: Not on file    Non-medical: Not on file  Tobacco Use  . Smoking status: Never Smoker  . Smokeless tobacco: Never Used  Substance and Sexual Activity  . Alcohol use: No  . Drug use: No  . Sexual activity: Not on file  Lifestyle  . Physical activity    Days per week: Not on file    Minutes per session: Not on file  .  Stress: Not on file  Relationships  . Social Herbalist on phone: Not on file    Gets together: Not on file    Attends religious service: Not on file    Active member of club or organization: Not on file    Attends meetings of clubs or organizations: Not on file    Relationship status: Not on file  . Intimate partner violence    Fear of current or ex partner: Not on file    Emotionally abused: Not on file    Physically abused: Not on file    Forced sexual activity: Not on file  Other Topics Concern  . Not on file  Social History Narrative  . Not on file    FAMILY HISTORY: Family History  Problem Relation Age of Onset  . Hypertension Father     ALLERGIES:  is allergic to food and pineapple.  MEDICATIONS:  Current Outpatient Medications  Medication Sig Dispense Refill  . albuterol (PROVENTIL HFA;VENTOLIN HFA) 108 (90 Base) MCG/ACT inhaler Inhale 2 puffs into the lungs every 4 (four) hours as needed for wheezing or shortness of breath. 18 g 0  . fexofenadine (ALLEGRA) 60 MG tablet Take 60 mg by mouth daily.    Marland Kitchen ibuprofen (ADVIL,MOTRIN) 200 MG tablet Take 200 mg by mouth every 6 (six) hours as needed  for moderate pain.     No current facility-administered medications for this visit.     REVIEW OF SYSTEMS:   Constitutional: Denies fevers, chills or abnormal night sweats Eyes: Denies blurriness of vision, double vision or watery eyes Ears, nose, mouth, throat, and face: Denies mucositis or sore throat Respiratory: Denies cough, dyspnea or wheezes Cardiovascular: Denies palpitation, chest discomfort or lower extremity swelling Gastrointestinal:  Denies nausea, heartburn or change in bowel habits Skin: Denies abnormal skin rashes Lymphatics: Denies new lymphadenopathy or easy bruising Neurological:Denies numbness, tingling or new weaknesses Behavioral/Psych: Mood is stable, no new changes  Breast: Denies any palpable lumps or discharge All other systems were  reviewed with the patient and are negative.  PHYSICAL EXAMINATION: ECOG PERFORMANCE STATUS: 1 - Symptomatic but completely ambulatory  Vitals:   07/30/19 1548  BP: 130/88  Pulse: 92  Resp: 18  Temp: 98.7 F (37.1 C)  SpO2: 97%   Filed Weights   07/30/19 1548  Weight: 216 lb 14.4 oz (98.4 kg)    GENERAL:alert, no distress and comfortable SKIN: skin color, texture, turgor are normal, no rashes or significant lesions EYES: normal, conjunctiva are pink and non-injected, sclera clear OROPHARYNX:no exudate, no erythema and lips, buccal mucosa, and tongue normal  NECK: supple, thyroid normal size, non-tender, without nodularity LYMPH:  no palpable lymphadenopathy in the cervical, axillary or inguinal LUNGS: clear to auscultation and percussion with normal breathing effort HEART: regular rate & rhythm and no murmurs and no lower extremity edema ABDOMEN:abdomen soft, non-tender and normal bowel sounds Musculoskeletal:no cyanosis of digits and no clubbing  PSYCH: alert & oriented x 3 with fluent speech NEURO: no focal motor/sensory deficits  LABORATORY DATA:  I have reviewed the data as listed Lab Results  Component Value Date   WBC 9.5 09/17/2015   HGB 11.8 (L) 09/17/2015   HCT 36.9 09/17/2015   MCV 88.5 09/17/2015   PLT 189 09/17/2015   Lab Results  Component Value Date   NA 138 09/17/2015   K 3.8 09/17/2015   CL 106 09/17/2015   CO2 24 09/17/2015    RADIOGRAPHIC STUDIES: I have personally reviewed the radiological reports and agreed with the findings in the report.  ASSESSMENT AND PLAN:  Thrombocytopenia (HCC) Lab review  05/27/2019: Platelet count 84, MPV 12.8 (WBC 8.2, hemoglobin 12) 05/21/2019: Platelet clumps were noted report not given 05/11/2019: Platelet count 76 09/17/2015: Platelet count 189  Differential diagnosis: 1.  Spurious thrombocytopenia: Will obtain platelet count in a citrate tube to verify the numbers. 2.  Given the increased mean platelet  volume, it is possible that she may have low-grade ITP. 3.  Other causes including medications, viruses like hepatitis B C and HIV are all unlikely given the lack of risk factors. 4.  Splenomegaly is in the differential.  Plan: 1.  Repeat CBC with differential in a blue top tube 2.  Hepatitis B, C, HIV 3.  Ultrasound of the abdomen for evaluation of splenomegaly  If all of this testing is negative then I suspect it is a low-grade ITP. Since the platelet count is adequate we do not recommend treatment at these levels.  Treatment is only indicated with the patient is having bleeding symptoms are if the platelet count were to be less than 50,000.  Return to clinic in 1 week to review the results of the blood work and decide on the treatment plan.    All questions were answered. The patient knows to call the clinic with any problems, questions  or concerns.   Sabas SousVinay K Mouna Yager, MD, MPH 07/30/2019    I, Molly Dorshimer, am acting as scribe for Serena CroissantVinay Candus Braud, MD.  I have reviewed the above documentation for accuracy and completeness, and I agree with the above.

## 2019-07-30 ENCOUNTER — Inpatient Hospital Stay: Payer: Medicaid Other | Attending: Hematology and Oncology | Admitting: Hematology and Oncology

## 2019-07-30 ENCOUNTER — Other Ambulatory Visit: Payer: Self-pay

## 2019-07-30 ENCOUNTER — Inpatient Hospital Stay: Payer: Medicaid Other

## 2019-07-30 DIAGNOSIS — Z79899 Other long term (current) drug therapy: Secondary | ICD-10-CM | POA: Insufficient documentation

## 2019-07-30 DIAGNOSIS — D696 Thrombocytopenia, unspecified: Secondary | ICD-10-CM | POA: Insufficient documentation

## 2019-07-30 DIAGNOSIS — Z791 Long term (current) use of non-steroidal anti-inflammatories (NSAID): Secondary | ICD-10-CM | POA: Insufficient documentation

## 2019-07-30 LAB — CBC WITH DIFFERENTIAL (CANCER CENTER ONLY)
Abs Immature Granulocytes: 0.04 10*3/uL (ref 0.00–0.07)
Basophils Absolute: 0 10*3/uL (ref 0.0–0.1)
Basophils Relative: 0 %
Eosinophils Absolute: 0.1 10*3/uL (ref 0.0–0.5)
Eosinophils Relative: 2 %
HCT: 37.5 % (ref 36.0–46.0)
Hemoglobin: 11.9 g/dL — ABNORMAL LOW (ref 12.0–15.0)
Immature Granulocytes: 0 %
Lymphocytes Relative: 37 %
Lymphs Abs: 3.6 10*3/uL (ref 0.7–4.0)
MCH: 27.4 pg (ref 26.0–34.0)
MCHC: 31.7 g/dL (ref 30.0–36.0)
MCV: 86.4 fL (ref 80.0–100.0)
Monocytes Absolute: 0.6 10*3/uL (ref 0.1–1.0)
Monocytes Relative: 6 %
Neutro Abs: 5.2 10*3/uL (ref 1.7–7.7)
Neutrophils Relative %: 55 %
Platelet Count: 202 10*3/uL (ref 150–400)
RBC: 4.34 MIL/uL (ref 3.87–5.11)
RDW: 13.5 % (ref 11.5–15.5)
WBC Count: 9.6 10*3/uL (ref 4.0–10.5)
nRBC: 0 % (ref 0.0–0.2)

## 2019-07-30 LAB — PLATELET BY CITRATE

## 2019-07-30 LAB — HIV ANTIBODY (ROUTINE TESTING W REFLEX): HIV Screen 4th Generation wRfx: NONREACTIVE

## 2019-07-30 LAB — HEPATITIS B SURFACE ANTIGEN: Hepatitis B Surface Ag: NONREACTIVE

## 2019-07-30 LAB — HEPATITIS C ANTIBODY: HCV Ab: NONREACTIVE

## 2019-07-30 NOTE — Assessment & Plan Note (Signed)
Lab review  05/27/2019: Platelet count 84, MPV 12.8 (WBC 8.2, hemoglobin 12) 05/21/2019: Platelet clumps were noted report not given 05/11/2019: Platelet count 76 09/17/2015: Platelet count 189  Differential diagnosis: 1.  Spurious thrombocytopenia: Will obtain platelet count in a citrate tube to verify the numbers. 2.  Given the increased mean platelet volume, it is possible that she may have low-grade ITP. 3.  Other causes including medications, viruses like hepatitis B C and HIV are all unlikely given the lack of risk factors. 4.  Splenomegaly is in the differential.  Plan: 1.  Repeat CBC with differential in a blue top tube 2.  Hepatitis B, C, HIV 3.  Ultrasound of the abdomen for evaluation of splenomegaly  If all of this testing is negative then I suspect it is a low-grade ITP. Since the platelet count is adequate we do not recommend treatment at these levels.  Treatment is only indicated with the patient is having bleeding symptoms are if the platelet count were to be less than 50,000.  Return to clinic in 1 week to review the results of the blood work and decide on the treatment plan.

## 2019-08-01 ENCOUNTER — Telehealth: Payer: Self-pay | Admitting: Hematology and Oncology

## 2019-08-01 NOTE — Telephone Encounter (Signed)
Left message re 10/27 telephone visit. Schedule mailed.

## 2019-08-06 ENCOUNTER — Telehealth: Payer: Self-pay | Admitting: Hematology and Oncology

## 2019-08-06 NOTE — Progress Notes (Signed)
  HEMATOLOGY-ONCOLOGY TELEPHONE VISIT PROGRESS NOTE  I connected with Kathryn Peters on 08/07/2019 at 10:45 AM EDT by telephone and verified that I am speaking with the correct person using two identifiers.  I discussed the limitations, risks, security and privacy concerns of performing an evaluation and management service by telephone and the availability of in person appointments.  I also discussed with the patient that there may be a patient responsible charge related to this service. The patient expressed understanding and agreed to proceed.   History of Present Illness: Kathryn Peters is a 23 y.o. female with above-mentioned history of thrombocytopenia. Lab work-up on 07/30/19 showed: Hg 11.9, platelets 202, HIV negative, HBV surface antigen negative, HCV antibody negative. She presents over the phone today to review her labs.    Observations/Objective:  No new problems or concerns   Assessment Plan:  Thrombocytopenia ([place]) Lab review  07/30/2019: Platelet count 202, hemoglobin 11.9 05/27/2019: Platelet count 84, MPV 12.8 (WBC 8.2, hemoglobin 12) 05/21/2019: Platelet clumps were noted report not given 05/11/2019: Platelet count 76 09/17/2015: Platelet count 189  Differential diagnosis: 1.  Spurious thrombocytopenia: 2.  Given the increased mean platelet volume, it is possible that she may have low-grade ITP. 3.  Other causes including medications, viruses like hepatitis B C and HIV are all unlikely given the lack of risk factors. 4.  Splenomegaly is in the differential.  Lab review: Hepatitis B, C, HIV: Negative Platelet count up to 202 and has normalized. Always draw her CBC in a blue top tube or a citrate tube to prevent clumping.  No further work-up is necessary at this time.  Please reconsult if she develops worsening thrombocytopenia or other additional cytopenias.    I discussed the assessment and treatment plan with the patient. The patient was provided an  opportunity to ask questions and all were answered. The patient agreed with the plan and demonstrated an understanding of the instructions. The patient was advised to call back or seek an in-person evaluation if the symptoms worsen or if the condition fails to improve as anticipated.   I provided 15 minutes of non-face-to-face time during this encounter.   Rulon Eisenmenger, MD 08/07/2019    I, Molly Dorshimer, am acting as scribe for Nicholas Lose, MD.  I have reviewed the above documentation for accuracy and completeness, and I agree with the above.

## 2019-08-07 ENCOUNTER — Inpatient Hospital Stay (HOSPITAL_BASED_OUTPATIENT_CLINIC_OR_DEPARTMENT_OTHER): Payer: Medicaid Other | Admitting: Hematology and Oncology

## 2019-08-07 DIAGNOSIS — D696 Thrombocytopenia, unspecified: Secondary | ICD-10-CM | POA: Diagnosis not present

## 2019-08-07 NOTE — Assessment & Plan Note (Addendum)
Lab review  07/30/2019: Platelet count 202, hemoglobin 11.9 05/27/2019: Platelet count 84, MPV 12.8 (WBC 8.2, hemoglobin 12) 05/21/2019: Platelet clumps were noted report not given 05/11/2019: Platelet count 76 09/17/2015: Platelet count 189  Differential diagnosis: 1.  Spurious thrombocytopenia: 2.  Given the increased mean platelet volume, it is possible that she may have low-grade ITP. 3.  Other causes including medications, viruses like hepatitis B C and HIV are all unlikely given the lack of risk factors. 4.  Splenomegaly is in the differential.  Lab review: Hepatitis B, C, HIV: Negative Platelet count up to 202 and has normalized.  No further work-up is necessary at this time.  Please reconsult if she develops worsening thrombocytopenia or other additional cytopenias.

## 2019-09-10 ENCOUNTER — Other Ambulatory Visit: Payer: Self-pay | Admitting: Surgery

## 2019-10-25 ENCOUNTER — Ambulatory Visit: Payer: Medicaid Other | Admitting: Physical Therapy

## 2019-11-02 ENCOUNTER — Other Ambulatory Visit: Payer: Self-pay | Admitting: Chiropractic Medicine

## 2019-11-02 DIAGNOSIS — M5412 Radiculopathy, cervical region: Secondary | ICD-10-CM

## 2019-11-02 DIAGNOSIS — S134XXA Sprain of ligaments of cervical spine, initial encounter: Secondary | ICD-10-CM

## 2019-11-02 DIAGNOSIS — M50322 Other cervical disc degeneration at C5-C6 level: Secondary | ICD-10-CM

## 2019-11-24 ENCOUNTER — Ambulatory Visit
Admission: RE | Admit: 2019-11-24 | Discharge: 2019-11-24 | Disposition: A | Discharge status: Home / Self Care | Payer: Medicaid Other | Source: Ambulatory Visit | Attending: Chiropractic Medicine | Admitting: Chiropractic Medicine

## 2019-11-24 ENCOUNTER — Other Ambulatory Visit: Payer: Self-pay

## 2019-11-24 ENCOUNTER — Other Ambulatory Visit: Payer: Self-pay | Admitting: Chiropractic Medicine

## 2019-11-24 DIAGNOSIS — M50322 Other cervical disc degeneration at C5-C6 level: Secondary | ICD-10-CM

## 2019-11-24 DIAGNOSIS — M5412 Radiculopathy, cervical region: Secondary | ICD-10-CM

## 2019-11-24 DIAGNOSIS — S134XXA Sprain of ligaments of cervical spine, initial encounter: Secondary | ICD-10-CM

## 2019-11-26 ENCOUNTER — Other Ambulatory Visit: Payer: Self-pay | Admitting: Chiropractic Medicine

## 2019-11-26 DIAGNOSIS — M50322 Other cervical disc degeneration at C5-C6 level: Secondary | ICD-10-CM

## 2019-11-26 DIAGNOSIS — M5412 Radiculopathy, cervical region: Secondary | ICD-10-CM

## 2019-11-26 DIAGNOSIS — S134XXA Sprain of ligaments of cervical spine, initial encounter: Secondary | ICD-10-CM

## 2019-12-25 ENCOUNTER — Other Ambulatory Visit: Payer: Self-pay | Admitting: Surgery

## 2020-04-11 ENCOUNTER — Encounter: Payer: Self-pay | Admitting: Pediatrics

## 2020-04-23 ENCOUNTER — Other Ambulatory Visit: Payer: Self-pay

## 2020-04-23 ENCOUNTER — Encounter (HOSPITAL_COMMUNITY): Payer: Self-pay

## 2020-04-23 ENCOUNTER — Ambulatory Visit (HOSPITAL_COMMUNITY)
Admission: EM | Admit: 2020-04-23 | Discharge: 2020-04-23 | Disposition: A | Discharge status: Home / Self Care | Payer: Medicaid Other

## 2020-04-23 DIAGNOSIS — T148XXA Other injury of unspecified body region, initial encounter: Secondary | ICD-10-CM

## 2020-04-23 MED ORDER — METHOCARBAMOL 500 MG PO TABS: 500.0000 mg | ORAL_TABLET | Freq: Three times a day (TID) | ORAL | 0 refills | Status: DC | PRN

## 2020-04-23 MED ORDER — NAPROXEN 500 MG PO TBEC: 500.0000 mg | DELAYED_RELEASE_TABLET | Freq: Two times a day (BID) | ORAL | 0 refills | Status: AC | PRN

## 2020-04-23 NOTE — ED Provider Notes (Signed)
[facility]    CSN: [id number] Arrival date & time: 04/23/20  1459      History   Chief Complaint Chief Complaint  Patient presents with  . Fall  . Arm Pain  . Leg Pain    HPI Kathryn Peters is a 24 y.o. female with pmh of Judi Cong George's syndrome presents to urgent care with left arm and leg pain x 2 days. Pts states she slipped and fell when walking landing on her left side. She reports skin abrasion to left him, soreness in left thigh and in left upper back. She denies any loc, no headache, dizziness, chest pain, sob, abd pain, weakness, numbness or tingling.    Past Medical History:  Diagnosis Date  . Di George's syndrome [facility])     Patient Active Problem List   Diagnosis Date Noted  . Thrombocytopenia (HCC) 07/30/2019  . Vitiligo 07/31/2012  . 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 TONSILLECTOMY      OB History    Gravida  1   Para      Term      Preterm      AB      Living        SAB      TAB      Ectopic      Multiple      Live Births               Home Medications    Prior to Admission medications   Medication Sig Start Date End Date Taking? Authorizing Provider  acetaminophen (TYLENOL) 500 MG tablet Take 500 mg by mouth every 6 (six) hours as needed.   Yes [provider]  albuterol (PROVENTIL HFA;VENTOLIN HFA) 108 (90 Base) MCG/ACT inhaler Inhale 2 puffs into the lungs every 4 (four) hours as needed for wheezing or shortness of breath. 10/07/17 11/06/17  Muthersbaugh, Dahlia Client, PA-C  fexofenadine (ALLEGRA) 60 MG tablet Take 60 mg by mouth daily.    [provider]  ibuprofen (ADVIL,MOTRIN) 200 MG tablet Take 200 mg by mouth every 6 (six) hours as needed for moderate pain.    [provider]  methocarbamol (ROBAXIN) 500 MG tablet Take 1 tablet (500 mg total) by mouth every 8 (eight) hours as needed for muscle spasms. 04/23/20   Rolla Etienne,  NP  naproxen (EC NAPROSYN) 500 MG EC tablet Take 1 tablet (500 mg total) by mouth 2 (two) times daily as needed. 04/23/20 04/23/21  Rolla Etienne, NP    Family History Family History  Problem Relation Age of Onset  . Hypertension Father     Social History Social History   Tobacco Use  . Smoking status: Never Smoker  . Smokeless tobacco: Never Used  Substance Use Topics  . Alcohol use: No  . Drug use: No     Allergies   Prunus persica, Food, and Pineapple   Review of Systems As stated in hpi, otherwise negative   Physical Exam Triage Vital Signs ED Triage Vitals  Enc Vitals Group     BP 04/23/20 1533 129/89     Pulse Rate 04/23/20 1533 99     Resp 04/23/20 1533 18     Temp 04/23/20 1533 99.6 F (37.6 C)     Temp Source 04/23/20 1533 Oral     SpO2 04/23/20 1533 100 %     Weight --      Height --  Head Circumference --      Peak Flow --      Pain Score 04/23/20 1531 10     Pain Loc --      Pain Edu? --      Excl. in GC? --    No data found.  Updated Vital Signs BP 129/89 (BP Location: Right Arm)   Pulse 99   Temp 99.6 F (37.6 C) (Oral)   Resp 18   LMP  (Within Months) Comment: 1 month  SpO2 100%   Physical Exam Constitutional:      General: She is not in acute distress.    Appearance: Normal appearance. She is not ill-appearing.  HENT:     Head: Normocephalic and atraumatic.  Eyes:     Extraocular Movements: Extraocular movements intact.     Pupils: Pupils are equal, round, and reactive to light.  Musculoskeletal:        General: No swelling or deformity. Normal range of motion.     Cervical back: Normal range of motion and neck supple. No rigidity or tenderness.     Comments: Tenderness on palpation of left trapezius muscle and left quadricep. Small scrape to left upper thigh. Exam o/w normal  Skin:    General: Skin is warm and dry.  Neurological:     General: No focal deficit present.     Mental Status: She is alert and oriented to  person, place, and time.     Motor: No weakness.     Deep Tendon Reflexes: Reflexes normal.  Psychiatric:        Mood and Affect: Mood normal.        Behavior: Behavior normal.      UC Treatments / Results  Labs (all labs ordered are listed, but only abnormal results are displayed) Labs Reviewed - No data to display  EKG   Radiology No results found.  Procedures Procedures (including critical care time)  Medications Ordered in UC Medications - No data to display  Initial Impression / Assessment and Plan / UC Course  I have reviewed the triage vital signs and the nursing notes.  Pertinent labs & imaging results that were available during my care of the patient were reviewed by me and considered in my medical decision making (see chart for details).  Muscle strain Contusion  -injury from recent fall. No indication for imaging as symptoms appear to be Msk related -strain to left trapezius and quad muscle -discussed use of heating pad, warm soaks, nsaids and muscle relaxer prn -f/u for worsening symptoms  Final Clinical Impressions(s) / UC Diagnoses   Final diagnoses:  Muscle strain     Discharge Instructions     Use heating pad several times daily for 15-20 at a time. Use Naprosyn for pain. Better to take this with food on your stomach. You can try the muscle relaxer for any muscle spasms. This medication can make you sleepy.     ED Prescriptions    Medication Sig Dispense Auth. Provider   naproxen (EC NAPROSYN) 500 MG EC tablet Take 1 tablet (500 mg total) by mouth 2 (two) times daily as needed. 30 tablet Rolla Etienne, NP   methocarbamol (ROBAXIN) 500 MG tablet Take 1 tablet (500 mg total) by mouth every 8 (eight) hours as needed for muscle spasms. 15 tablet Rolla Etienne, NP     PDMP not reviewed this encounter.   Rolla Etienne, NP 04/25/20 1513

## 2020-04-23 NOTE — ED Triage Notes (Signed)
Pt presents with left arm and pain left arm pain x 2 days, after she slipped and fell over her left side when walking. Tylenol  Gives no relief.

## 2020-04-23 NOTE — Discharge Instructions (Addendum)
Use heating pad several times daily for 15-20 at a time. Use Naprosyn for pain. Better to take this with food on your stomach. You can try the muscle relaxer for any muscle spasms. This medication can make you sleepy.

## 2020-08-23 ENCOUNTER — Ambulatory Visit: Payer: Medicaid Other

## 2020-08-29 ENCOUNTER — Ambulatory Visit
Admission: RE | Admit: 2020-08-29 | Discharge: 2020-08-29 | Disposition: A | Discharge status: Home / Self Care | Payer: Medicaid Other | Source: Ambulatory Visit | Attending: Family Medicine | Admitting: Family Medicine

## 2020-08-29 ENCOUNTER — Other Ambulatory Visit: Payer: Self-pay

## 2020-08-29 ENCOUNTER — Other Ambulatory Visit: Payer: Self-pay | Admitting: Family Medicine

## 2020-08-29 DIAGNOSIS — R52 Pain, unspecified: Secondary | ICD-10-CM

## 2020-12-25 ENCOUNTER — Emergency Department (HOSPITAL_BASED_OUTPATIENT_CLINIC_OR_DEPARTMENT_OTHER)
Admission: EM | Admit: 2020-12-25 | Discharge: 2020-12-25 | Disposition: A | Discharge status: Home / Self Care | Payer: Medicaid Other | Attending: Emergency Medicine | Admitting: Emergency Medicine

## 2020-12-25 ENCOUNTER — Other Ambulatory Visit: Payer: Self-pay

## 2020-12-25 ENCOUNTER — Encounter (HOSPITAL_BASED_OUTPATIENT_CLINIC_OR_DEPARTMENT_OTHER): Payer: Self-pay | Admitting: *Deleted

## 2020-12-25 DIAGNOSIS — L02411 Cutaneous abscess of right axilla: Secondary | ICD-10-CM | POA: Insufficient documentation

## 2020-12-25 DIAGNOSIS — L02412 Cutaneous abscess of left axilla: Secondary | ICD-10-CM | POA: Diagnosis not present

## 2020-12-25 DIAGNOSIS — L732 Hidradenitis suppurativa: Secondary | ICD-10-CM

## 2020-12-25 MED ORDER — IBUPROFEN 400 MG PO TABS
600.0000 mg | ORAL_TABLET | Freq: Once | ORAL | Status: AC
  Administered 2020-12-25: 600 mg via ORAL
  Filled 2020-12-25: qty 1

## 2020-12-25 MED ORDER — DOXYCYCLINE HYCLATE 100 MG PO CAPS: 100.0000 mg | ORAL_CAPSULE | Freq: Two times a day (BID) | ORAL | 0 refills | Status: DC

## 2020-12-25 MED ORDER — DOXYCYCLINE HYCLATE 100 MG PO TABS
100.0000 mg | ORAL_TABLET | Freq: Once | ORAL | Status: AC
  Administered 2020-12-25: 100 mg via ORAL
  Filled 2020-12-25: qty 1

## 2020-12-25 NOTE — ED Notes (Signed)
Patient verbalized understanding of dc instructions, prescriptions, follow up referrals and reasons to return to ER for reevaluation.  

## 2020-12-25 NOTE — Discharge Instructions (Addendum)
It was our pleasure to provide your ER care today - we hope that you feel better.  Keep area very clean. Use antibacterial soap/warm water/warm compresses 1-2x/day.   May clip hair in areas, but avoid using razor.   Take doxycycline (antibiotic) as prescribed.   Take acetaminophen or ibuprofen as need.   Follow up with general surgeon in 1-2 weeks - call office to arrange appointment.   Return to ER if worse, new symptoms, fevers, increased swelling, spreading redness, or other concern.

## 2020-12-25 NOTE — ED Provider Notes (Signed)
[facility] EMERGENCY DEPT Provider Note   CSN: [id number] Arrival date & time: 12/25/20  1251     History No chief complaint on file.   Kathryn Peters is a 25 y.o. female.  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 Symptoms recurrent x months/years. When flares up notes increased soreness to area - symptoms acute onset, moderate, constant, persistent.  Has previously been referred for outpatient f/u but not until 3/21. Denies specific single area of increased swelling, redness, pain or drainage, more diffuse/multiple areas bilateral axilla. No fever or chills. States hx similar areas in groin area as well - no current symptoms there. Denies specific dx mrsa. ?hidradenitis.   The history is provided by the patient and a parent.       Past Medical History:  Diagnosis Date  . Di George's syndrome [facility])     Patient Active Problem List   Diagnosis Date Noted  . Thrombocytopenia (HCC) 07/30/2019  . Vitiligo 07/31/2012  . 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 TONSILLECTOMY       OB History    Gravida  1   Para      Term      Preterm      AB      Living        SAB      IAB      Ectopic      Multiple      Live Births              Family History  Problem Relation Age of Onset  . Hypertension Father     Social History   Tobacco Use  . Smoking status: Never Smoker  . Smokeless tobacco: Never Used  Substance Use Topics  . Alcohol use: No  . Drug use: No    Home Medications Prior to Admission medications   Medication Sig Start Date End Date Taking? Authorizing Provider  acetaminophen (TYLENOL) 500 MG tablet Take 500 mg by mouth every 6 (six) hours as needed.    [provider]  albuterol (PROVENTIL HFA;VENTOLIN HFA) 108 (90 Base) MCG/ACT inhaler Inhale 2 puffs into the lungs every 4 (four) hours as needed for wheezing or shortness of  breath. 10/07/17 11/06/17  Muthersbaugh, Dahlia Client, PA-C  fexofenadine (ALLEGRA) 60 MG tablet Take 60 mg by mouth daily.    [provider]  ibuprofen (ADVIL,MOTRIN) 200 MG tablet Take 200 mg by mouth every 6 (six) hours as needed for moderate pain.    [provider]  methocarbamol (ROBAXIN) 500 MG tablet Take 1 tablet (500 mg total) by mouth every 8 (eight) hours as needed for muscle spasms. 04/23/20   Rolla Etienne, NP  naproxen (EC NAPROSYN) 500 MG EC tablet Take 1 tablet (500 mg total) by mouth 2 (two) times daily as needed. 04/23/20 04/23/21  Rolla Etienne, NP    Allergies    Prunus persica, Food, and Pineapple  Review of Systems   Review of Systems  Constitutional: Negative for chills and fever.  Gastrointestinal: Negative for nausea and vomiting.  Musculoskeletal: Negative for myalgias.  Skin:       Soreness bilateral axilla.    Physical Exam Updated Vital Signs BP (!) 132/94 (BP Location: Left Arm)   Pulse (!) 105   Temp 99 F (37.2 C) (Oral)   Resp 18   Ht 1.626 m (5\' 4" )   Wt 93.4 kg  LMP 11/27/2020   SpO2 100%   BMI 35.36 kg/m   Physical Exam Vitals and nursing note reviewed.  Constitutional:      Appearance: Normal appearance. She is well-developed.  HENT:     Head: Atraumatic.     Nose: Nose normal.     Mouth/Throat:     Mouth: Mucous membranes are moist.  Eyes:     General: No scleral icterus.    Conjunctiva/sclera: Conjunctivae normal.  Neck:     Trachea: No tracheal deviation.  Cardiovascular:     Pulses: Normal pulses.  Pulmonary:     Effort: Pulmonary effort is normal. No respiratory distress.  Abdominal:     General: There is no distension.  Genitourinary:    Comments: No cva tenderness.  Musculoskeletal:        General: No swelling.     Cervical back: Neck supple. No muscular tenderness.  Skin:    General: Skin is warm and dry.     Findings: No rash.     Comments: Area of induration, tenderness bilateral axilla.  ?hidradenitis, ?early infection. No areas felt amenable to I and D.   Neurological:     Mental Status: She is alert.     Comments: Alert, speech normal.   Psychiatric:        Mood and Affect: Mood normal.     ED Results / Procedures / Treatments   Labs (all labs ordered are listed, but only abnormal results are displayed) Labs Reviewed - No data to display  EKG None  Radiology No results found.  Procedures Procedures   Medications Ordered in ED Medications  doxycycline (VIBRA-TABS) tablet 100 mg (has no administration in time range)  ibuprofen (ADVIL) tablet 600 mg (has no administration in time range)    ED Course  I have reviewed the triage vital signs and the nursing notes.  Pertinent labs & imaging results that were available during my care of the patient were reviewed by me and considered in my medical decision making (see chart for details).    MDM Rules/Calculators/A&P                          No meds pta or today. Confirmed nkda.   Doxycycline po. Ibuprofen po.   Reviewed nursing notes and prior charts for additional history.   Rec outpatient general surgery f/u.   Return precautions provided.    Final Clinical Impression(s) / ED Diagnoses Final diagnoses:  None    Rx / DC Orders ED Discharge Orders    None       Cathren Laine, MD 12/25/20 1340

## 2020-12-25 NOTE — ED Triage Notes (Signed)
Recurrent abscesses under bilateral arms. Pt has referral to see a surgeon on 01-08-21.

## 2021-01-18 ENCOUNTER — Encounter (HOSPITAL_BASED_OUTPATIENT_CLINIC_OR_DEPARTMENT_OTHER): Payer: Self-pay | Admitting: *Deleted

## 2021-01-18 ENCOUNTER — Emergency Department (HOSPITAL_BASED_OUTPATIENT_CLINIC_OR_DEPARTMENT_OTHER)
Admission: EM | Admit: 2021-01-18 | Discharge: 2021-01-18 | Disposition: A | Discharge status: Home / Self Care | Payer: Medicaid Other | Attending: Emergency Medicine | Admitting: Emergency Medicine

## 2021-01-18 ENCOUNTER — Other Ambulatory Visit: Payer: Self-pay

## 2021-01-18 ENCOUNTER — Emergency Department (HOSPITAL_BASED_OUTPATIENT_CLINIC_OR_DEPARTMENT_OTHER): Payer: Medicaid Other

## 2021-01-18 DIAGNOSIS — R059 Cough, unspecified: Secondary | ICD-10-CM | POA: Diagnosis present

## 2021-01-18 DIAGNOSIS — R000 Tachycardia, unspecified: Secondary | ICD-10-CM | POA: Diagnosis not present

## 2021-01-18 DIAGNOSIS — R079 Chest pain, unspecified: Secondary | ICD-10-CM

## 2021-01-18 DIAGNOSIS — U071 COVID-19: Secondary | ICD-10-CM | POA: Insufficient documentation

## 2021-01-18 LAB — CBC WITH DIFFERENTIAL/PLATELET
Abs Immature Granulocytes: 0.02 10*3/uL (ref 0.00–0.07)
Basophils Absolute: 0 10*3/uL (ref 0.0–0.1)
Basophils Relative: 1 %
Eosinophils Absolute: 0.1 10*3/uL (ref 0.0–0.5)
Eosinophils Relative: 1 %
HCT: 36.1 % (ref 36.0–46.0)
Hemoglobin: 11.3 g/dL — ABNORMAL LOW (ref 12.0–15.0)
Immature Granulocytes: 0 %
Lymphocytes Relative: 27 %
Lymphs Abs: 1.7 10*3/uL (ref 0.7–4.0)
MCH: 27.7 pg (ref 26.0–34.0)
MCHC: 31.3 g/dL (ref 30.0–36.0)
MCV: 88.5 fL (ref 80.0–100.0)
Monocytes Absolute: 1.1 10*3/uL — ABNORMAL HIGH (ref 0.1–1.0)
Monocytes Relative: 17 %
Neutro Abs: 3.5 10*3/uL (ref 1.7–7.7)
Neutrophils Relative %: 54 %
Platelets: 173 10*3/uL (ref 150–400)
RBC: 4.08 MIL/uL (ref 3.87–5.11)
RDW: 13.7 % (ref 11.5–15.5)
WBC: 6.5 10*3/uL (ref 4.0–10.5)
nRBC: 0 % (ref 0.0–0.2)

## 2021-01-18 LAB — BASIC METABOLIC PANEL
Anion gap: 8 (ref 5–15)
BUN: 6 mg/dL (ref 6–20)
CO2: 24 mmol/L (ref 22–32)
Calcium: 8.4 mg/dL — ABNORMAL LOW (ref 8.9–10.3)
Chloride: 106 mmol/L (ref 98–111)
Creatinine, Ser: 0.81 mg/dL (ref 0.44–1.00)
GFR, Estimated: >60 mL/min (ref >60)
Glucose, Bld: 106 mg/dL — ABNORMAL HIGH (ref 70–99)
Potassium: 3.9 mmol/L (ref 3.5–5.1)
Sodium: 138 mmol/L (ref 135–145)

## 2021-01-18 LAB — PREGNANCY, URINE: Preg Test, Ur: NEGATIVE

## 2021-01-18 LAB — D-DIMER, QUANTITATIVE: D-Dimer, Quant: 1.59 ug/mL-FEU — ABNORMAL HIGH (ref 0.00–0.50)

## 2021-01-18 MED ORDER — KETOROLAC TROMETHAMINE 15 MG/ML IJ SOLN: 15.0000 mg | Freq: Once | INTRAMUSCULAR | Status: DC

## 2021-01-18 MED ORDER — MOLNUPIRAVIR EUA 200MG CAPSULE: 4.0000 | ORAL_CAPSULE | Freq: Two times a day (BID) | ORAL | 0 refills | Status: AC

## 2021-01-18 MED ORDER — IOHEXOL 350 MG/ML SOLN
100.0000 mL | Freq: Once | INTRAVENOUS | Status: AC | PRN
  Administered 2021-01-18: 100 mL via INTRAVENOUS

## 2021-01-18 NOTE — ED Provider Notes (Signed)
[facility] EMERGENCY DEPT Provider Note   CSN: [id number] Arrival date & time: 01/18/21  1341     History Chief Complaint  Patient presents with  . Covid Positive    Kathryn Peters is a 25 y.o. female.   Cough Cough characteristics:  Non-productive Severity:  Moderate Onset quality:  Gradual Duration:  4 days Timing:  Constant Progression:  Worsening Chronicity:  New Context: upper respiratory infection (Covid positive on home test)   Relieved by:  Nothing Worsened by:  Nothing Ineffective treatments:  Cough suppressants and decongestant Associated symptoms: chest pain, myalgias and rhinorrhea   Associated symptoms: no chills, no fever, no headaches, no rash and no shortness of breath        Past Medical History:  Diagnosis Date  . Di George's syndrome [facility])     Patient Active Problem List   Diagnosis Date Noted  . Thrombocytopenia (HCC) 07/30/2019  . Vitiligo 07/31/2012  . 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 TONSILLECTOMY       OB History    Gravida  1   Para      Term      Preterm      AB      Living        SAB      IAB      Ectopic      Multiple      Live Births              Family History  Problem Relation Age of Onset  . Hypertension Father     Social History   Tobacco Use  . Smoking status: Never Smoker  . Smokeless tobacco: Never Used  Substance Use Topics  . Alcohol use: No  . Drug use: No    Home Medications Prior to Admission medications   Medication Sig Start Date End Date Taking? Authorizing Provider  acetaminophen (TYLENOL) 500 MG tablet Take 500 mg by mouth every 6 (six) hours as needed.    [provider]  albuterol (PROVENTIL HFA;VENTOLIN HFA) 108 (90 Base) MCG/ACT inhaler Inhale 2 puffs into the lungs every 4 (four) hours as needed for wheezing or shortness of breath. 10/07/17 11/06/17  Muthersbaugh, Dahlia Client, PA-C   doxycycline (VIBRAMYCIN) 100 MG capsule Take 1 capsule (100 mg total) by mouth 2 (two) times daily. 12/25/20   Cathren Laine, MD  fexofenadine (ALLEGRA) 60 MG tablet Take 60 mg by mouth daily.    [provider]  ibuprofen (ADVIL,MOTRIN) 200 MG tablet Take 200 mg by mouth every 6 (six) hours as needed for moderate pain.    [provider]  methocarbamol (ROBAXIN) 500 MG tablet Take 1 tablet (500 mg total) by mouth every 8 (eight) hours as needed for muscle spasms. 04/23/20   Rolla Etienne, NP  naproxen (EC NAPROSYN) 500 MG EC tablet Take 1 tablet (500 mg total) by mouth 2 (two) times daily as needed. 04/23/20 04/23/21  Rolla Etienne, NP    Allergies    Prunus persica, Food, and Pineapple  Review of Systems   Review of Systems  Constitutional: Negative for chills and fever.  HENT: Positive for congestion and rhinorrhea.   Respiratory: Positive for cough. Negative for shortness of breath.   Cardiovascular: Positive for chest pain. Negative for palpitations.  Gastrointestinal: Negative for diarrhea, nausea and vomiting.  Genitourinary: Negative for difficulty urinating and dysuria.  Musculoskeletal: Positive for myalgias. Negative for arthralgias  and back pain.  Skin: Negative for rash and wound.  Neurological: Negative for light-headedness and headaches.    Physical Exam Updated Vital Signs BP 137/90   Pulse 98   Temp 98.7 F (37.1 C) (Oral)   Resp 18   Ht 5\' 4"  (1.626 m)   Wt 95.3 kg   LMP 12/14/2020   SpO2 100%   BMI 36.05 kg/m   Physical Exam Vitals reviewed. Exam conducted with a chaperone present.  Constitutional:      General: She is not in acute distress.    Appearance: Normal appearance.  HENT:     Head: Normocephalic and atraumatic.     Nose: No rhinorrhea.  Eyes:     General:        Right eye: No discharge.        Left eye: No discharge.     Conjunctiva/sclera: Conjunctivae normal.  Cardiovascular:     Rate and Rhythm: Regular rhythm.  Tachycardia present.  Pulmonary:     Effort: Pulmonary effort is normal. No respiratory distress.     Breath sounds: No stridor.  Abdominal:     General: Abdomen is flat. There is no distension.     Palpations: Abdomen is soft.  Musculoskeletal:        General: No tenderness or signs of injury.  Skin:    General: Skin is warm and dry.  Neurological:     General: No focal deficit present.     Mental Status: She is alert. Mental status is at baseline.     Motor: No weakness.  Psychiatric:        Mood and Affect: Mood normal.        Behavior: Behavior normal.     ED Results / Procedures / Treatments   Labs (all labs ordered are listed, but only abnormal results are displayed) Labs Reviewed  CBC WITH DIFFERENTIAL/PLATELET - Abnormal; Notable for the following components:      Result Value   Hemoglobin 11.3 (*)    Monocytes Absolute 1.1 (*)    All other components within normal limits  BASIC METABOLIC PANEL - Abnormal; Notable for the following components:   Glucose, Bld 106 (*)    Calcium 8.4 (*)    All other components within normal limits  D-DIMER, QUANTITATIVE - Abnormal; Notable for the following components:   D-Dimer, Quant 1.59 (*)    All other components within normal limits  PREGNANCY, URINE    EKG None  Radiology No results found.  Procedures Procedures   Medications Ordered in ED Medications - No data to display  ED Course  I have reviewed the triage vital signs and the nursing notes.  Pertinent labs & imaging results that were available during my care of the patient were reviewed by me and considered in my medical decision making (see chart for details).    MDM Rules/Calculators/A&P                          Covid positive at home.  Symptoms consistent with flulike illness.  Patient describes chest pain with deep inspiration.  Low risk Wells will get D-dimer.  Will get other screening labs EKG reviewed by me shows sinus rhythm with no acute ischemic  change interval abnormality or arrhythmia.  Chest x-ray to be ordered as well.  D-dimer is positive patient will get CT PE study.  Pt care was handed off to on coming provider at 1500.  Complete history  and physical and current plan have been communicated.  Please refer to their note for the remainder of ED care and ultimate disposition.  Pt seen in conjunction with Dr. Lynelle Doctor   Final Clinical Impression(s) / ED Diagnoses Final diagnoses:  COVID  Chest pain, unspecified type    Rx / DC Orders ED Discharge Orders    None       Sabino Donovan, MD 01/18/21 1511

## 2021-01-18 NOTE — Discharge Instructions (Signed)
I have given you a prescription for molnupiravir.  You do not have to take this medication however this is one of the new monoclonal antibodies that have been shown to reduce the risk of serious complications from covid in patients that are higher risk.  The CT scan did not show any evidence of pneumonia or blood clot.  Take over-the-counter medications as needed.  Return as needed for worsening symptoms.

## 2021-01-18 NOTE — ED Provider Notes (Signed)
Pt seen by Dr Myrtis Ser.   Please see his note.  Pt with positive outpatient test.   Presents for persistent sx.   CT pe negative for acute process.  Discussed monupiravir.  Patient not interested right now.  I will give her a prescription that she can use if she changes her mind   Linwood Dibbles, MD 01/18/21 1715

## 2021-01-18 NOTE — ED Triage Notes (Signed)
covid positive yesterday by home test.  URI symptoms x 4 days. Denies fever. Denies SOB.

## 2021-09-09 ENCOUNTER — Other Ambulatory Visit: Payer: Self-pay

## 2021-09-09 ENCOUNTER — Ambulatory Visit (HOSPITAL_COMMUNITY)
Admission: EM | Admit: 2021-09-09 | Discharge: 2021-09-09 | Disposition: A | Discharge status: Home / Self Care | Payer: Medicaid Other | Attending: Family Medicine | Admitting: Family Medicine

## 2021-09-09 ENCOUNTER — Encounter (HOSPITAL_COMMUNITY): Payer: Self-pay | Admitting: Emergency Medicine

## 2021-09-09 DIAGNOSIS — M79601 Pain in right arm: Secondary | ICD-10-CM

## 2021-09-09 DIAGNOSIS — H9202 Otalgia, left ear: Secondary | ICD-10-CM

## 2021-09-09 DIAGNOSIS — M79602 Pain in left arm: Secondary | ICD-10-CM

## 2021-09-09 MED ORDER — NAPROXEN 375 MG PO TABS: 375.0000 mg | ORAL_TABLET | Freq: Two times a day (BID) | ORAL | 0 refills | Status: DC | PRN

## 2021-09-09 NOTE — ED Provider Notes (Signed)
[facility]    CSN: [id number] Arrival date & time: 09/09/21  1408      History   Chief Complaint Chief Complaint  Patient presents with   Arm Pain    bilat    HPI Kathryn Peters is a 25 y.o. female.   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  She has not taken any medication for her symptoms since yesterday.  She did try 1 dose of Tylenol without relief.  Patient has full range of motion.  Denies any numbness and tingling in her hands or in her fingers.  Left ear pain intermittently for several weeks. No problem currently with hearing. No prior evaluation.  No history of chronic otitis media or externa.  Patient denies pain currently.  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 Authorizing Provider  naproxen (NAPROSYN) 375 MG tablet Take 1 tablet (375 mg total) by mouth 2 (two) times daily as needed. 09/09/21  Yes Bing Neighbors, FNP  acetaminophen (TYLENOL) 500 MG tablet Take 500 mg by mouth every 6 (six) hours as needed.    [provider]  albuterol (PROVENTIL HFA;VENTOLIN HFA) 108 (90 Base) MCG/ACT inhaler Inhale 2 puffs into the lungs every 4 (four) hours as needed for wheezing or shortness of breath. 10/07/17 11/06/17   Muthersbaugh, Dahlia Client, PA-C  doxycycline (VIBRAMYCIN) 100 MG capsule Take 1 capsule (100 mg total) by mouth 2 (two) times daily. 12/25/20   Cathren Laine, MD  fexofenadine (ALLEGRA) 60 MG tablet Take 60 mg by mouth daily.    [provider]  methocarbamol (ROBAXIN) 500 MG tablet Take 1 tablet (500 mg total) by mouth every 8 (eight) hours as needed for muscle spasms. 04/23/20   Rolla Etienne, NP    Family History Family History  Problem Relation Age of Onset   Hypertension Father     Social History Social History   Tobacco Use   Smoking status: Never   Smokeless tobacco: Never  Substance Use Topics   Alcohol use: No   Drug use: No     Allergies   Prunus persica, Food, and Pineapple   Review of Systems Review of Systems Pertinent negatives listed in HPI  Physical Exam Triage Vital Signs ED Triage Vitals [09/09/21 1600]  Enc Vitals Group     BP 118/79     Pulse Rate 88     Resp 17     Temp  98.8 F (37.1 C)     Temp Source Oral     SpO2 97 %     Weight      Height      Head Circumference      Peak Flow      Pain Score      Pain Loc      Pain Edu?      Excl. in GC?    No data found.  Updated Vital Signs BP 118/79 (BP Location: Left Arm)   Pulse 88   Temp 98.8 F (37.1 C) (Oral)   Resp 17   LMP 08/23/2021   SpO2 97%   Visual Acuity Right Eye Distance:   Left Eye Distance:   Bilateral Distance:    Right Eye Near:   Left Eye Near:    Bilateral Near:     Physical Exam Constitutional: Patient appears well-developed and well-nourished. No distress. HENT: Normocephalic, atraumatic, External right and left ear normal. Oropharynx is clear and moist.  Eyes: Conjunctivae and EOM are normal. PERRLA, no scleral icterus. Neck: Normal ROM. Neck supple. No JVD. No tracheal deviation. No thyromegaly. CVS: RRR, S1/S2 +, no murmurs, no gallops, no carotid bruit.  Pulmonary: Effort and breath sounds normal, no stridor, rhonchi, wheezes, rales.  Abdominal:  Soft. BS +, no distension, tenderness, rebound or guarding.  Neuro: Alert. Normal reflexes, muscle tone coordination. No cranial nerve deficit. Skin: Skin is warm and dry. No rash noted. Not diaphoretic. No erythema. No pallor. Psychiatric: Normal mood and affect. Behavior, judgment, thought content normal.   UC Treatments / Results  Labs (all labs ordered are listed, but only abnormal results are displayed) Labs Reviewed - No data to display  EKG   Radiology No results found.  Procedures Procedures (including critical care time)  Medications Ordered in UC Medications - No data to display  Initial Impression / Assessment and Plan / UC Course  I have reviewed the triage vital signs and the nursing notes.  Pertinent labs & imaging results that were available during my care of the patient were reviewed by me and considered in my medical decision making (see chart for details).    Left ear otalgia, exam unremarkable unknown etiology of intermittent pain.  Continue to monitor at home and managed with Tylenol or ibuprofen.  Bilateral arm pain Naprosyn twice daily as needed.  Work note provided. Final Clinical Impressions(s) / UC Diagnoses   Final diagnoses:  Bilateral arm pain  Otalgia of left ear   Discharge Instructions   None    ED Prescriptions     Medication Sig Dispense Auth. Provider   naproxen (NAPROSYN) 375 MG tablet Take 1 tablet (375 mg total) by mouth 2 (two) times daily as needed. 20 tablet Bing Neighbors, FNP      PDMP not reviewed this encounter.   Bing Neighbors, FNP 09/09/21 2109

## 2021-09-09 NOTE — ED Triage Notes (Signed)
Pt c/o bilat arm pains since last night. Reports does a lot of lifting  denies falls or injuries.

## 2021-09-10 ENCOUNTER — Ambulatory Visit (HOSPITAL_COMMUNITY): Payer: Self-pay

## 2021-10-16 ENCOUNTER — Emergency Department (HOSPITAL_BASED_OUTPATIENT_CLINIC_OR_DEPARTMENT_OTHER)
Admission: EM | Admit: 2021-10-16 | Discharge: 2021-10-16 | Disposition: A | Discharge status: Home / Self Care | Payer: Medicaid Other | Attending: Emergency Medicine | Admitting: Emergency Medicine

## 2021-10-16 ENCOUNTER — Encounter (HOSPITAL_BASED_OUTPATIENT_CLINIC_OR_DEPARTMENT_OTHER): Payer: Self-pay | Admitting: *Deleted

## 2021-10-16 ENCOUNTER — Other Ambulatory Visit: Payer: Self-pay

## 2021-10-16 DIAGNOSIS — H9203 Otalgia, bilateral: Secondary | ICD-10-CM | POA: Diagnosis present

## 2021-10-16 DIAGNOSIS — H6123 Impacted cerumen, bilateral: Secondary | ICD-10-CM

## 2021-10-16 DIAGNOSIS — Z20822 Contact with and (suspected) exposure to covid-19: Secondary | ICD-10-CM | POA: Insufficient documentation

## 2021-10-16 DIAGNOSIS — J029 Acute pharyngitis, unspecified: Secondary | ICD-10-CM | POA: Diagnosis not present

## 2021-10-16 LAB — RESP PANEL BY RT-PCR (FLU A&B, COVID) ARPGX2
Influenza A by PCR: NEGATIVE
Influenza B by PCR: NEGATIVE
SARS Coronavirus 2 by RT PCR: NEGATIVE

## 2021-10-16 LAB — GROUP A STREP BY PCR: Group A Strep by PCR: NOT DETECTED

## 2021-10-16 MED ORDER — LIDOCAINE VISCOUS HCL 2 % MT SOLN
15.0000 mL | Freq: Once | OROMUCOSAL | Status: AC
  Administered 2021-10-16: 15 mL via OROMUCOSAL
  Filled 2021-10-16: qty 15

## 2021-10-16 MED ORDER — CARBAMIDE PEROXIDE 6.5 % OT SOLN: 5.0000 [drp] | Freq: Once | OTIC | Status: DC

## 2021-10-16 MED ORDER — IBUPROFEN 400 MG PO TABS
600.0000 mg | ORAL_TABLET | Freq: Once | ORAL | Status: AC
  Administered 2021-10-16: 600 mg via ORAL
  Filled 2021-10-16: qty 1

## 2021-10-16 MED ORDER — LIDOCAINE VISCOUS HCL 2 % MT SOLN: 15.0000 mL | OROMUCOSAL | 1 refills | Status: DC | PRN

## 2021-10-16 MED ORDER — DOCUSATE SODIUM 50 MG/5ML PO LIQD
1.0000 mL | Freq: Once | ORAL | Status: AC
  Administered 2021-10-16: 10 mg via OTIC
  Filled 2021-10-16: qty 10

## 2021-10-16 NOTE — ED Notes (Signed)
EMT-P provided AVS using Teachback Method. Patient verbalizes understanding of Discharge Instructions. Opportunity for Questioning and Answers were provided by EMT-P. Patient Discharged from ED.  ? ?

## 2021-10-16 NOTE — ED Provider Notes (Signed)
[facility] EM[facility] DEPT Provider Note   CSN: [id number] Arrival date & time: 10/16/21  [id number]     History Chief Complaint  Patient presents with   Sore Throat   Otalgia    Kathryn Peters is a 26 y.o. female presents the emergency department for sore throat, intermittent rhinorrhea/congestion, and a cough for the past 2 days.  Additionally she mentions she is been having ear pain left greater than right for the past 3 weeks with decreased hearing.  She reports her sore throat worsened today and has been unable to tolerate any foods or fluids.  No medications trialed.  She denies any fever, chest pain, shortness of breath, abdominal, nausea, or vomiting.  Denies any drooling or trouble swallowing.  She denies any medical or surgical history.  Denies any daily medications.  No known drug allergies.  Denies any tobacco, EtOH, illicit drug use.  Sore Throat Pertinent negatives include no chest pain, no abdominal pain and no shortness of breath.  Otalgia Associated symptoms: congestion, rhinorrhea and sore throat   Associated symptoms: no abdominal pain, no cough, no fever, no rash and no vomiting       Home Medications Prior to Admission medications   Medication Sig Start Date End Date Taking? Authorizing Provider  lidocaine (XYLOCAINE) 2 % solution Use as directed 15 mLs in the mouth or throat as needed for mouth pain. 10/16/21  Yes Sherrell Puller, PA-C  acetaminophen (TYLENOL) 500 MG tablet Take 500 mg by mouth every 6 (six) hours as needed.    [provider]  albuterol (PROVENTIL HFA;VENTOLIN HFA) 108 (90 Base) MCG/ACT inhaler Inhale 2 puffs into the lungs every 4 (four) hours as needed for wheezing or shortness of breath. 10/07/17 11/06/17  Muthersbaugh, Jarrett Soho, PA-C  doxycycline (VIBRAMYCIN) 100 MG capsule Take 1 capsule (100 mg total) by mouth 2 (two) times daily. 12/25/20   Lajean Saver, MD  fexofenadine (ALLEGRA) 60 MG tablet Take 60 mg by mouth daily.     [provider]  methocarbamol (ROBAXIN) 500 MG tablet Take 1 tablet (500 mg total) by mouth every 8 (eight) hours as needed for muscle spasms. 04/23/20   Rudolpho Sevin, NP  naproxen (NAPROSYN) 375 MG tablet Take 1 tablet (375 mg total) by mouth 2 (two) times daily as needed. 09/09/21   Scot Jun, FNP      Allergies    Prunus persica, Food, and Pineapple    Review of Systems   Review of Systems  Constitutional:  Negative for chills and fever.  HENT:  Positive for congestion, ear pain, rhinorrhea and sore throat. Negative for drooling and trouble swallowing.   Eyes:  Negative for pain and visual disturbance.  Respiratory:  Negative for cough and shortness of breath.   Cardiovascular:  Negative for chest pain and palpitations.  Gastrointestinal:  Negative for abdominal pain and vomiting.  Genitourinary:  Negative for dysuria and hematuria.  Musculoskeletal:  Negative for arthralgias and back pain.  Skin:  Negative for color change and rash.  Neurological:  Negative for seizures and syncope.  All other systems reviewed and are negative.  Physical Exam Updated Vital Signs BP 128/84 (BP Location: Right Arm)    Pulse 87    Temp 98.4 F (36.9 C)    Resp 20    Ht 5\' 4"  (1.626 m)    Wt 91.2 kg    LMP 09/27/2021    SpO2 99%    BMI 34.50 kg/m  Physical Exam Vitals and  nursing note reviewed.  Constitutional:      General: She is not in acute distress.    Appearance: Normal appearance. She is not ill-appearing or toxic-appearing.  HENT:     Ears:     Comments: Bilateral cerumen impaction    Nose: Congestion present.     Mouth/Throat:     Mouth: Mucous membranes are moist.     Pharynx: Uvula midline. Posterior oropharyngeal erythema present. No pharyngeal swelling, oropharyngeal exudate or uvula swelling.     Comments: Mild pharyngeal erythema.  No edema or exudate visualized.  Moist mucous membranes.  Airway patent.  Uvula midline. Eyes:     General: No scleral  icterus. Pulmonary:     Effort: Pulmonary effort is normal. No respiratory distress.     Comments: Lungs clear to auscultation bilaterally.  No respiratory distress, accessory muscle use, tripoding, nasal flaring, or cyanosis present.  Patient satting 99% on room air. Skin:    General: Skin is dry.     Findings: No rash.  Neurological:     General: No focal deficit present.     Mental Status: She is alert. Mental status is at baseline.  Psychiatric:        Mood and Affect: Mood normal.    ED Results / Procedures / Treatments   Labs (all labs ordered are listed, but only abnormal results are displayed) Labs Reviewed  RESP PANEL BY RT-PCR (FLU A&B, COVID) ARPGX2  GROUP A STREP BY PCR    EKG None  Radiology No results found.  Procedures Procedures   Medications Ordered in ED Medications  lidocaine (XYLOCAINE) 2 % viscous mouth solution 15 mL (15 mLs Mouth/Throat Given 10/16/21 1243)  ibuprofen (ADVIL) tablet 600 mg (600 mg Oral Given 10/16/21 1243)  docusate (COLACE) 50 MG/5ML liquid 10 mg (10 mg Both EARS Given 10/16/21 1253)    ED Course/ Medical Decision Making/ A&P                           Medical Decision Making  26 year old female presents emergency department with cough and cold symptoms for the past 2 days.  Vital signs are reassuring.  Patient is normotensive, afebrile, normal heart rate, satting 99% on room air with no increased work of breathing.  Physical exam shows bilateral cerumen impactions with mild pharyngeal erythema.  No edema or exudate noted.  Patient controlling secretions well.  Uvula midline.  Airways patent.  Moist mucous membranes.  COVID, flu, and strep test were negative.  Nursing staff did note ear irrigation of both ears.  TMs are now visible and intact with good light reflex.  External ear canals without edema or erythema.  Viscous lidocaine given for sore throat.  On reevaluation, the patient reports that she feels much better after the viscous  lidocaine and she can hear significantly better after the cerumen impaction washout.  Given the negative labs and reassuring physical exam, patient will be discharged home with viscous lidocaine.  I discussed this is lidocaine and strict return precautions with the patient.  Patient agrees to plan.  Patient is stable and being discharged home in good condition.  Final Clinical Impression(s) / ED Diagnoses Final diagnoses:  Bilateral impacted cerumen  Sore throat    Rx / DC Orders ED Discharge Orders          Ordered    lidocaine (XYLOCAINE) 2 % solution  As needed        10/16/21 1405  Sherrell Puller, PA-C 10/16/21 [place], Nathan, MD 10/16/21 1538

## 2021-10-16 NOTE — ED Triage Notes (Signed)
Pt has ahd 2 days of sore throat and bil ear pressure.

## 2021-10-16 NOTE — Discharge Instructions (Addendum)
WithYou were seen here today for evaluation of your sore throat, cough, and ear pain.  You tested negative for COVID, flu, and strep throat.  This is likely a viral illness that she can over-the-counter medications like Tylenol cough and cold.  On exam you had a buildup of earwax in both of your ears are cleaned out today.  You can try over-the-counter medication such as Debrox to apply to the area as needed to prevent this from happening again.  Patient to keep your mouth well lubricated by using candies, throat lozenges, or cough drops.  Please make sure to stay well-hydrated with plenty of fluids, mainly water.  Please make sure to obtain plenty rest.  A work note has been provided for you.  If you have any drooling, shortness of breath, chest pain, or trouble swallowing, please return the nearest emergency department for evaluation.

## 2021-10-16 NOTE — ED Notes (Signed)
Earwick soaked in colace placed in bilateral ears to facilitate softening of wax in the canal. Patient has small ears and a small ear canal making ear wax removal difficult.

## 2021-10-16 NOTE — ED Notes (Signed)
ED Provider at bedside. 

## 2021-12-18 ENCOUNTER — Ambulatory Visit (HOSPITAL_COMMUNITY)
Admission: EM | Admit: 2021-12-18 | Discharge: 2021-12-18 | Disposition: A | Discharge status: Home / Self Care | Payer: Medicaid Other | Attending: Family Medicine | Admitting: Family Medicine

## 2021-12-18 ENCOUNTER — Encounter (HOSPITAL_COMMUNITY): Payer: Self-pay

## 2021-12-18 DIAGNOSIS — L02411 Cutaneous abscess of right axilla: Secondary | ICD-10-CM | POA: Diagnosis not present

## 2021-12-18 DIAGNOSIS — L299 Pruritus, unspecified: Secondary | ICD-10-CM | POA: Diagnosis not present

## 2021-12-18 MED ORDER — DOXYCYCLINE HYCLATE 100 MG PO CAPS: 100.0000 mg | ORAL_CAPSULE | Freq: Two times a day (BID) | ORAL | 0 refills | Status: DC

## 2021-12-18 MED ORDER — IBUPROFEN 800 MG PO TABS: 800.0000 mg | ORAL_TABLET | Freq: Three times a day (TID) | ORAL | 0 refills | Status: DC

## 2021-12-18 MED ORDER — HYDROCORTISONE-ACETIC ACID 1-2 % OT SOLN: 3.0000 [drp] | Freq: Two times a day (BID) | OTIC | 0 refills | Status: DC

## 2021-12-18 NOTE — ED Provider Notes (Incomplete)
[facility]    CSN: [id number] Arrival date & time: 12/18/21  1821      History   Chief Complaint No chief complaint on file.   HPI Kathryn Peters is a 26 y.o. female.   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  Patient has been seen at the ER along with urgent care multiple times and treated with doxycycline for hidradenitis flares related to recurrent axilla abscess.  Patient is also concerned about a bump no overhead pushes tender. She reports no history of recurrent acne and the bump has no fluid. She has not applied warm compressed.  She also complains of inner ear itching bilaterally.   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 Authorizing Provider  acetaminophen (TYLENOL) 500 MG tablet Take 500 mg by mouth every 6 (six) hours as needed.    [provider]  albuterol (PROVENTIL HFA;VENTOLIN HFA) 108 (90 Base) MCG/ACT inhaler Inhale 2 puffs into the lungs every 4 (four) hours as needed for wheezing or shortness of breath. 10/07/17 11/06/17  Muthersbaugh, Dahlia Client, PA-C  doxycycline (VIBRAMYCIN) 100 MG capsule Take 1 capsule (100 mg total) by mouth 2 (two) times daily. 12/25/20   Cathren Laine, MD  fexofenadine (ALLEGRA) 60 MG tablet Take 60 mg by mouth daily.    [provider]  lidocaine (XYLOCAINE) 2 % solution Use as directed 15 mLs in the mouth or  throat as needed for mouth pain. 10/16/21   Achille Rich, PA-C  methocarbamol (ROBAXIN) 500 MG tablet Take 1 tablet (500 mg total) by mouth every 8 (eight) hours as needed for muscle spasms. 04/23/20   Rolla Etienne, NP  naproxen (NAPROSYN) 375 MG tablet Take 1 tablet (375 mg total) by mouth 2 (two) times daily as needed. 09/09/21   Bing Neighbors, FNP    Family History Family History  Problem Relation Age of Onset   Hypertension Father     Social History Social History   Tobacco Use   Smoking status: Never   Smokeless tobacco: Never  Vaping Use   Vaping Use: Never used  Substance Use Topics   Alcohol use: No   Drug use: No     Allergies   Prunus persica, Food, and Pineapple   Review of Systems Review of Systems Pertinent negatives listed in HPI   Physical Exam Triage Vital Signs ED Triage Vitals  Enc Vitals Group     BP      Pulse      Resp      Temp  Temp src      SpO2      Weight      Height      Head Circumference      Peak Flow      Pain Score      Pain Loc      Pain Edu?      Excl. in GC?    No data found.  Updated Vital Signs BP 125/88 (BP Location: Left Arm)    Pulse 95    Temp 99.1 F (37.3 C) (Oral)    Resp 18    SpO2 96%   Visual Acuity Right Eye Distance:   Left Eye Distance:   Bilateral Distance:    Right Eye Near:   Left Eye Near:    Bilateral Near:     Physical Exam   UC Treatments / Results  Labs (all labs ordered are listed, but only abnormal results are displayed) Labs Reviewed - No data to display  EKG   Radiology No results found.  Procedures Procedures (including critical care time)  Medications Ordered in UC Medications - No data to display  Initial Impression / Assessment and Plan / UC Course  I have reviewed the triage vital signs and the nursing notes.  Pertinent labs & imaging results that were available during my care of the patient were reviewed by me and considered in my medical decision  making (see chart for details).     *** Final Clinical Impressions(s) / UC Diagnoses   Final diagnoses:  Abscess of axilla, right  Ear itching   Discharge Instructions   None    ED Prescriptions   None    PDMP not reviewed this encounter.

## 2021-12-18 NOTE — ED Triage Notes (Signed)
Pt reports lump under her arm x 2 days. ?Pt reports bump in the middle of her forehead. ?

## 2022-07-20 ENCOUNTER — Emergency Department (HOSPITAL_BASED_OUTPATIENT_CLINIC_OR_DEPARTMENT_OTHER)
Admission: EM | Admit: 2022-07-20 | Discharge: 2022-07-20 | Disposition: A | Discharge status: Home / Self Care | Payer: Medicaid Other | Attending: Emergency Medicine | Admitting: Emergency Medicine

## 2022-07-20 ENCOUNTER — Encounter (HOSPITAL_BASED_OUTPATIENT_CLINIC_OR_DEPARTMENT_OTHER): Payer: Self-pay | Admitting: Emergency Medicine

## 2022-07-20 ENCOUNTER — Other Ambulatory Visit: Payer: Self-pay

## 2022-07-20 DIAGNOSIS — H6123 Impacted cerumen, bilateral: Secondary | ICD-10-CM | POA: Insufficient documentation

## 2022-07-20 DIAGNOSIS — H7292 Unspecified perforation of tympanic membrane, left ear: Secondary | ICD-10-CM | POA: Insufficient documentation

## 2022-07-20 MED ORDER — OFLOXACIN 0.3 % OT SOLN: 5.0000 [drp] | Freq: Two times a day (BID) | OTIC | 0 refills | Status: AC

## 2022-07-20 MED ORDER — MECLIZINE HCL 25 MG PO TABS
25.0000 mg | ORAL_TABLET | Freq: Once | ORAL | Status: DC
Start: 2022-07-20 — End: 2022-07-20
  Filled 2022-07-20: qty 1

## 2022-07-20 NOTE — ED Provider Notes (Incomplete)
[facility] EMERGENCY DEPARTMENT Provider Note   CSN: [id number] Arrival date & time: 07/20/22  1038     History {Add pertinent medical, surgical, social history, OB history to HPI:1} Chief Complaint  Patient presents with  . Ear Fullness    Kathryn Peters is a 26 y.o. female with a past medical cerumen impaction who complains history of recurrent of decreased hearing on the right side, ear fullness for the past 3 weeks.  Patient states that she sees an ear nose and throat doctor but did not wish to to make an appointment.  She denies significant ear pain.  She hears a "whooshing sound" in her ears.  She has been using peroxide without improvement.  She denies sore throat, fevers, headache.  The history is provided by the patient.  Ear Fullness      Home Medications Prior to Admission medications   Medication Sig Start Date End Date Taking? Authorizing Provider  acetaminophen (TYLENOL) 500 MG tablet Take 500 mg by mouth every 6 (six) hours as needed.    [provider]  acetic acid-hydrocortisone (VOSOL-HC) OTIC solution Place 3 drops into both ears 2 (two) times daily. 12/18/21   Bing Neighbors, FNP  albuterol (PROVENTIL HFA;VENTOLIN HFA) 108 (90 Base) MCG/ACT inhaler Inhale 2 puffs into the lungs every 4 (four) hours as needed for wheezing or shortness of breath. 10/07/17 11/06/17  Muthersbaugh, Dahlia Client, PA-C  doxycycline (VIBRAMYCIN) 100 MG capsule Take 1 capsule (100 mg total) by mouth 2 (two) times daily. 12/18/21   Bing Neighbors, FNP  fexofenadine (ALLEGRA) 60 MG tablet Take 60 mg by mouth daily.    [provider]  ibuprofen (ADVIL) 800 MG tablet Take 1 tablet (800 mg total) by mouth 3 (three) times daily. 12/18/21   Bing Neighbors, FNP  lidocaine (XYLOCAINE) 2 % solution Use as directed 15 mLs in the mouth or throat as needed for mouth pain. 10/16/21   Achille Rich, PA-C  methocarbamol (ROBAXIN) 500 MG tablet Take 1 tablet (500 mg  total) by mouth every 8 (eight) hours as needed for muscle spasms. 04/23/20   Rolla Etienne, NP  naproxen (NAPROSYN) 375 MG tablet Take 1 tablet (375 mg total) by mouth 2 (two) times daily as needed. 09/09/21   Bing Neighbors, FNP      Allergies    Prunus persica, Food, and Pineapple    Review of Systems   Review of Systems  Physical Exam Updated Vital Signs BP (!) 144/90   Pulse 88   Temp 98.5 F (36.9 C) (Oral)   Resp 18   Ht 5\' 4"  (1.626 m)   SpO2 98%   BMI 34.50 kg/m  Physical Exam Vitals and nursing note reviewed.  Constitutional:      General: She is not in acute distress.    Appearance: She is well-developed. She is not diaphoretic.  HENT:     Head: Normocephalic and atraumatic.     Right Ear: External ear normal.     Left Ear: External ear normal.     Ears:     Comments: Canal is are dry and scaly bilaterally Very dry wax present.  I am able to see just past the cerumen on both sides and see no evidence of erythema or opacity of the TMs.  No pain with movement of the pinna, no auricular lymphadenopathy, no mastoid tenderness or swelling.    Nose: Nose normal.     Mouth/Throat:     Mouth:  Mucous membranes are moist.  Eyes:     General: No scleral icterus.    Conjunctiva/sclera: Conjunctivae normal.  Cardiovascular:     Rate and Rhythm: Normal rate and regular rhythm.     Heart sounds: Normal heart sounds. No murmur heard.    No friction rub. No gallop.  Pulmonary:     Effort: Pulmonary effort is normal. No respiratory distress.     Breath sounds: Normal breath sounds.  Abdominal:     General: Bowel sounds are normal. There is no distension.     Palpations: Abdomen is soft. There is no mass.     Tenderness: There is no abdominal tenderness. There is no guarding.  Musculoskeletal:     Cervical back: Normal range of motion.  Skin:    General: Skin is warm and dry.  Neurological:     Mental Status: She is alert and oriented to person, place, and time.   Psychiatric:        Behavior: Behavior normal.    ED Results / Procedures / Treatments   Labs (all labs ordered are listed, but only abnormal results are displayed) Labs Reviewed - No data to display  EKG None  Radiology No results found.  Procedures Procedures  {Document cardiac monitor, telemetry assessment procedure when appropriate:1}  Medications Ordered in ED Medications - No data to display  ED Course/ Medical Decision Making/ A&P                           Medical Decision Making  ***  {Document critical care time when appropriate:1} {Document review of labs and clinical decision tools ie heart score, Chads2Vasc2 etc:1}  {Document your independent review of radiology images, and any outside records:1} {Document your discussion with family members, caretakers, and with consultants:1} {Document social determinants of health affecting pt's care:1} {Document your decision making why or why not admission, treatments were needed:1} Final Clinical Impression(s) / ED Diagnoses Final diagnoses:  None    Rx / DC Orders ED Discharge Orders     None

## 2022-07-20 NOTE — ED Notes (Signed)
Discharge instructions reviewed with patient. Patient verbalizes understanding, no further questions at this time. Medications/prescriptions and follow up information provided. No acute distress noted at time of departure.  

## 2022-07-20 NOTE — ED Triage Notes (Signed)
Patient presents to ED via POV from home. Here with 3 week history of ear fullness, reports right is worse than left.

## 2022-07-20 NOTE — Discharge Instructions (Signed)
Follow these instructions at home: Medicines Take over-the-counter and prescription medicines only as told by your health care provider. If you were prescribed an antibiotic medicine, use it as told by your health care provider. Do not stop using the antibiotic even if you start to feel better. Ear care Keep your ear dry. This is very important. Follow instructions from your health care provider about how to keep your ear dry. You may need to wear waterproof earplugs when bathing and swimming. If directed, apply heat to your affected ear as often as told by your health care provider. Use the heat source that your health care provider recommends, such as a moist heat pack or a heating pad. This will help to relieve pain. Place a towel between your skin and the heat source. Leave the heat on for 20-30 minutes. Remove the heat if your skin turns bright red. This is especially important if you are unable to feel pain, heat, or cold. You have a greater risk of getting burned. Contact a health care provider if: You have a fever. You have ear pain. You have mucus or blood draining from your ear. You have hearing loss, dizziness, or ringing in your ear.

## 2022-07-20 NOTE — ED Notes (Addendum)
Unable to remove ear wax with elephant ear wash. Provider notified. Attempting with syringe.  Pt c/o dizziness & pain after attempting to remove ear wax with syringe, meclizine ordered.   11:50: pt states she's unsure if she is dizzy, holding off on meclizine for now

## 2022-11-24 ENCOUNTER — Ambulatory Visit (HOSPITAL_COMMUNITY)
Admission: RE | Admit: 2022-11-24 | Discharge: 2022-11-24 | Disposition: A | Discharge status: Home / Self Care | Payer: Medicaid Other | Source: Ambulatory Visit | Attending: Family Medicine | Admitting: Family Medicine

## 2022-11-24 ENCOUNTER — Encounter (HOSPITAL_COMMUNITY): Payer: Self-pay

## 2022-11-24 VITALS — BP 137/89 | HR 100 | Temp 98.3°F | Resp 18

## 2022-11-24 DIAGNOSIS — J069 Acute upper respiratory infection, unspecified: Secondary | ICD-10-CM | POA: Insufficient documentation

## 2022-11-24 DIAGNOSIS — Z1152 Encounter for screening for COVID-19: Secondary | ICD-10-CM | POA: Insufficient documentation

## 2022-11-24 DIAGNOSIS — J019 Acute sinusitis, unspecified: Secondary | ICD-10-CM | POA: Insufficient documentation

## 2022-11-24 MED ORDER — BENZONATATE 100 MG PO CAPS: 100.0000 mg | ORAL_CAPSULE | Freq: Three times a day (TID) | ORAL | 0 refills | Status: DC | PRN

## 2022-11-24 MED ORDER — AMOXICILLIN 875 MG PO TABS: 875.0000 mg | ORAL_TABLET | Freq: Two times a day (BID) | ORAL | 0 refills | Status: AC

## 2022-11-24 NOTE — ED Triage Notes (Signed)
Pt c/o cough, malaise, sore throat, abd pain headache, dry stuffy nose   Onset ~ 1 week ago

## 2022-11-24 NOTE — ED Provider Notes (Addendum)
[place]    CSN: [id number] Arrival date & time: 11/24/22  1545      History   Chief Complaint Chief Complaint  Patient presents with   Cough    Sore throat - Entered by patient    HPI Kathryn Peters is a 27 y.o. female.    Cough  Here for 7 to 8 days of cough and congestion and headache and sore throat.  No fever and no vomiting or diarrhea.  Last menstrual cycle was about 10 days ago.  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 Authorizing Provider  amoxicillin (AMOXIL) 875 MG tablet Take 1 tablet (875 mg total) by mouth 2 (two) times daily for 7 days. 11/24/22 12/01/22 Yes Khamryn Calderone, Gwenlyn Perking, MD  benzonatate (TESSALON) 100 MG capsule Take 1 capsule (100 mg total) by mouth 3 (three) times daily as needed for cough. 11/24/22  Yes Donte Kary, Gwenlyn Perking, MD  albuterol (PROVENTIL HFA;VENTOLIN HFA) 108 (90 Base) MCG/ACT inhaler Inhale 2 puffs into the lungs every 4 (four) hours as needed for wheezing or shortness of breath. 10/07/17 11/06/17  Muthersbaugh, Jarrett Soho, PA-C    Family History Family History  Problem Relation Age of Onset   Hypertension Father     Social History Social History   Tobacco Use   Smoking status: Never   Smokeless tobacco: Never  Vaping Use   Vaping Use: Never used  Substance Use Topics   Alcohol use: No   Drug use: No     Allergies   Prunus persica, Food, and Pineapple   Review of  Systems Review of Systems  Respiratory:  Positive for cough.      Physical Exam Triage Vital Signs ED Triage Vitals  Enc Vitals Group     BP 11/24/22 1623 137/89     Pulse Rate 11/24/22 1621 100     Resp 11/24/22 1621 18     Temp 11/24/22 1621 98.3 F (36.8 C)     Temp Source 11/24/22 1621 Oral     SpO2 11/24/22 1621 97 %     Weight --      Height --      Head Circumference --      Peak Flow --      Pain Score 11/24/22 1621 10     Pain Loc --      Pain Edu? --      Excl. in [place]? --    No data found.  Updated Vital Signs BP 137/89 (BP Location: Left Arm)   Pulse 100   Temp 98.3 F (36.8 C) (Oral)   Resp  18   SpO2 97%   Breastfeeding No   Visual Acuity Right Eye Distance:   Left Eye Distance:   Bilateral Distance:    Right Eye Near:   Left Eye Near:    Bilateral Near:     Physical Exam Vitals reviewed.  Constitutional:      General: She is not in acute distress.    Appearance: She is not toxic-appearing.  HENT:     Right Ear: Tympanic membrane and ear canal normal.     Left Ear: Tympanic membrane and ear canal normal.     Nose: Congestion present.     Mouth/Throat:     Mouth: Mucous membranes are moist.     Pharynx: No oropharyngeal exudate or posterior oropharyngeal erythema.  Eyes:     Extraocular Movements: Extraocular movements intact.     Conjunctiva/sclera: Conjunctivae normal.     Pupils: Pupils are equal, round, and reactive to light.  Cardiovascular:     Rate and Rhythm: Normal rate and regular rhythm.     Heart sounds: No murmur heard. Pulmonary:     Effort: Pulmonary effort is normal. No respiratory distress.     Breath sounds: No stridor. No wheezing, rhonchi or rales.  Musculoskeletal:     Cervical back: Neck supple.  Lymphadenopathy:     Cervical: No cervical adenopathy.  Skin:    Capillary Refill: Capillary refill takes less than 2 seconds.     Coloration: Skin is not jaundiced or pale.  Neurological:     General: No focal  deficit present.     Mental Status: She is alert and oriented to person, place, and time.  Psychiatric:        Behavior: Behavior normal.      UC Treatments / Results  Labs (all labs ordered are listed, but only abnormal results are displayed) Labs Reviewed  SARS CORONAVIRUS 2 (TAT 6-24 HRS)    EKG   Radiology No results found.  Procedures Procedures (including critical care time)  Medications Ordered in UC Medications - No data to display  Initial Impression / Assessment and Plan / UC Course  I have reviewed the triage vital signs and the nursing notes.  Pertinent labs & imaging results that were available during my care of the patient were reviewed by me and considered in my medical decision making (see chart for details).        I am going to treat for acute sinusitis with her length of symptoms. Tessalon Perles are sent for the cough, and if she is not improving in the next 48 hours, she will begin antibiotics.   At discharge patient had more questions about the plan.  She got her mom on the phone and we discussed the presentation and whether or not we needed to do any swabs for viral illnesses.  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  We discussed whether or not any viral testing is helpful, and it would make them both feel better if she had a COVID swab today even though she does not have any treatment indicated if positive at this time.  She has not had any shortness of breath though she has had a little pleuritic pain on the right lower rib cage.  COVID swab is done today so that she knows, that if that is going on if she worsens that it may be due to that.  We also discussed her getting the COVID-vaccine sometime soon. Final Clinical Impressions(s) / UC Diagnoses  Final diagnoses:  Viral URI with cough  Acute sinusitis, recurrence not specified, unspecified location     Discharge Instructions      Take benzonatate 100 mg, 1 tab every 8  hours as needed for cough.  If not improving in the next 48 hours begin amoxicillin 875 mg--1 tab twice daily for 7 days   You have been swabbed for COVID, and the test will result in the next 24 hours. Our staff will call you if positive. If the COVID test is positive, you should quarantine for 5 days from the start of your symptoms.  On days 6-10 from the start of your illness, you should wear a mask if out in public.       ED Prescriptions     Medication Sig Dispense Auth. Provider   amoxicillin (AMOXIL) 875 MG tablet Take 1 tablet (875 mg total) by mouth 2 (two) times daily for 7 days. 14 tablet Exie Chrismer, Gwenlyn Perking, MD   benzonatate (TESSALON) 100 MG capsule Take 1 capsule (100 mg total) by mouth 3 (three) times daily as needed for cough. 21 capsule Barrett Henle, MD      PDMP not reviewed this encounter.   Barrett Henle, MD 11/24/22 1725    Barrett Henle, MD 11/24/22 [phone]

## 2022-11-24 NOTE — Discharge Instructions (Addendum)
Take benzonatate 100 mg, 1 tab every 8 hours as needed for cough.  If not improving in the next 48 hours begin amoxicillin 875 mg--1 tab twice daily for 7 days   You have been swabbed for COVID, and the test will result in the next 24 hours. Our staff will call you if positive. If the COVID test is positive, you should quarantine for 5 days from the start of your symptoms.  On days 6-10 from the start of your illness, you should wear a mask if out in public.

## 2022-11-25 LAB — SARS CORONAVIRUS 2 (TAT 6-24 HRS): SARS Coronavirus 2: NEGATIVE

## 2023-03-21 ENCOUNTER — Other Ambulatory Visit: Payer: Self-pay

## 2023-03-21 ENCOUNTER — Ambulatory Visit (HOSPITAL_COMMUNITY)
Admission: EM | Admit: 2023-03-21 | Discharge: 2023-03-21 | Disposition: A | Discharge status: Home / Self Care | Payer: Medicaid Other | Attending: Family Medicine | Admitting: Family Medicine

## 2023-03-21 ENCOUNTER — Encounter (HOSPITAL_COMMUNITY): Payer: Self-pay | Admitting: *Deleted

## 2023-03-21 DIAGNOSIS — L739 Follicular disorder, unspecified: Secondary | ICD-10-CM

## 2023-03-21 DIAGNOSIS — R109 Unspecified abdominal pain: Secondary | ICD-10-CM | POA: Diagnosis not present

## 2023-03-21 LAB — POCT URINALYSIS DIP (MANUAL ENTRY)
Bilirubin, UA: NEGATIVE
Blood, UA: NEGATIVE
Glucose, UA: NEGATIVE mg/dL
Ketones, POC UA: NEGATIVE mg/dL
Leukocytes, UA: NEGATIVE
Nitrite, UA: NEGATIVE
Protein Ur, POC: NEGATIVE mg/dL
Spec Grav, UA: 1.02 (ref 1.010–1.025)
Urobilinogen, UA: 0.2 E.U./dL
pH, UA: 6 (ref 5.0–8.0)

## 2023-03-21 LAB — POCT URINE PREGNANCY: Preg Test, Ur: NEGATIVE

## 2023-03-21 MED ORDER — CARBAMIDE PEROXIDE 6.5 % OT SOLN: 5.0000 [drp] | Freq: Two times a day (BID) | OTIC | 0 refills | Status: DC

## 2023-03-21 MED ORDER — DOXYCYCLINE HYCLATE 100 MG PO CAPS: 100.0000 mg | ORAL_CAPSULE | Freq: Two times a day (BID) | ORAL | 0 refills | Status: AC

## 2023-03-21 MED ORDER — DOXYCYCLINE HYCLATE 100 MG PO CAPS: 100.0000 mg | ORAL_CAPSULE | Freq: Two times a day (BID) | ORAL | 0 refills | Status: DC

## 2023-03-21 NOTE — ED Triage Notes (Signed)
Pt reports multiple problems . Pt reports cramnps in bil legs and ABD  since yesterday. Pt has a abscess in RT axilla for unknown length of time that has drained green liquid. Pt also reports decreased hearing.

## 2023-03-21 NOTE — ED Provider Notes (Signed)
[facility]    CSN: [id number] Arrival date & time: 03/21/23  1145      History   Chief Complaint Chief Complaint  Patient presents with   Abdominal Pain    HPI Kathryn Peters is a 27 y.o. female.   HPI Patient here for evaluation reports that she is currently experiencing cramps in abdomen and now reports cramps all over her body.   Patient denies any nausea, vomiting or diarrhea.  She had a regular bowel movement today. She is also reporting right axillary abscess which has been draining green discharge. She was previously prescribed mupirocin ointment most recently.  She denies fever or severe pain at the site of the abscess however reports some discomfort when laying flat and her arm is in a dependent position.    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 Authorizing Provider  albuterol (PROVENTIL HFA;VENTOLIN HFA) 108 (90 Base) MCG/ACT inhaler Inhale 2 puffs into the lungs every 4 (four) hours as needed for wheezing or shortness of breath. 10/07/17 11/06/17  Muthersbaugh, Dahlia Client, PA-C  benzonatate (TESSALON) 100 MG capsule Take 1 capsule (100 mg total) by mouth 3 (three) times daily as needed for cough. 11/24/22   Zenia Resides, MD  carbamide peroxide (DEBROX) 6.5 % OTIC solution Place 5 drops into both ears 2 (two) times daily. 03/21/23   Bing Neighbors, NP  doxycycline (VIBRAMYCIN) 100 MG capsule Take 1 capsule (100  mg total) by mouth 2 (two) times daily for 7 days. 03/21/23 03/28/23  Bing Neighbors, NP    Family History Family History  Problem Relation Age of Onset   Hypertension Father     Social History Social History   Tobacco Use   Smoking status: Never   Smokeless tobacco: Never  Vaping Use   Vaping Use: Never used  Substance Use Topics   Alcohol use: No   Drug use: No     Allergies   Prunus persica, Food, and Pineapple   Review of Systems Review of Systems Pertinent negatives listed in HPI   Physical Exam Triage Vital Signs ED Triage Vitals  Enc Vitals Group     BP 03/21/23 1228 124/85     Pulse Rate 03/21/23 1228 94     Resp 03/21/23 1228 18     Temp 03/21/23 1228 98.8 F (37.1 C)     Temp src --      SpO2 03/21/23 1228 96 %     Weight --      Height --      Head Circumference --      Peak  Flow --      Pain Score 03/21/23 1226 10     Pain Loc --      Pain Edu? --      Excl. in [place]? --    No data found.  Updated Vital Signs BP 124/85   Pulse 94   Temp 98.8 F (37.1 C)   Resp 18   LMP 03/14/2023   SpO2 96%   Visual Acuity Right Eye Distance:   Left Eye Distance:   Bilateral Distance:    Right Eye Near:   Left Eye Near:    Bilateral Near:     Physical Exam Vitals reviewed.  Constitutional:      Appearance: She is well-developed.  HENT:     Head: Normocephalic and atraumatic.     Right Ear: There is impacted cerumen.     Left Ear: There is impacted cerumen.  Eyes:     Extraocular Movements: Extraocular movements intact.     Pupils: Pupils are equal, round, and reactive to light.  Cardiovascular:     Rate and Rhythm: Normal rate and regular rhythm.  Pulmonary:     Effort: Pulmonary effort is normal.     Breath sounds: Normal breath sounds.  Abdominal:     Tenderness: There is no right CVA tenderness, left CVA tenderness or rebound.  Musculoskeletal:     Cervical back: Normal range of motion.  Skin:    General: Skin is warm and dry.      Capillary Refill: Capillary refill takes less than 2 seconds.     Comments: Folliculitis with open skin wound present right axilla   Neurological:     General: No focal deficit present.     Mental Status: She is alert.      UC Treatments / Results  Labs (all labs ordered are listed, but only abnormal results are displayed) Labs Reviewed  POCT URINE PREGNANCY  POCT URINALYSIS DIP (MANUAL ENTRY)    EKG   Radiology No results found.  Procedures Procedures (including critical care time)  Medications Ordered in UC Medications - No data to display  Initial Impression / Assessment and Plan / UC Course  I have reviewed the triage vital signs and the nursing notes.  Pertinent labs & imaging results that were available during my care of the patient were reviewed by me and considered in my medical decision making (see chart for details).    Abdominal cramping and folliculitis UA unremarkable and HCG pregnancy negative. RN attempted to irrigate ears however unable to fully evacuate impacted cerumen.  Prescribed Debrox drops to be used will have patient follow-up with ENT or her primary care doctor for further management of recurrent cerumen impaction.  Encouraged to try ibuprofen for abdominal cramping which she reports expands all over her body.  Exam is unremarkable low suspicion for acute abdomen.  Patient verbalized understanding and agreement with plan today. Final Clinical Impressions(s) / UC Diagnoses   Final diagnoses:  Abdominal cramping  Folliculitis of right axilla     Discharge Instructions      Your urine is normal.  Urine pregnancy is negative. Take over the counter Ibuprofen for body and abdominal cramping. Earwax in your ears is likely causing the changes in your hearing.  I am sending over ear drop to loosen wax as the nurse was unable to get all of the wax from your ears due to discomfort you were experiencing. Use drops to loosen wax in your ears.  Recommend follow-up with ENT  if problems continues.     ED Prescriptions     Medication Sig Dispense Auth. Provider   doxycycline (VIBRAMYCIN) 100 MG capsule  (Status: Discontinued) Take 1 capsule (100 mg total) by mouth 2 (two) times daily for 7 days. 14 capsule Bing Neighbors, NP   carbamide peroxide (DEBROX) 6.5 % OTIC solution  (Status: Discontinued) Place 5 drops into both ears 2 (two) times daily. 15 mL Bing Neighbors, NP   doxycycline (VIBRAMYCIN) 100 MG capsule Take 1 capsule (100 mg total) by mouth 2 (two) times daily for 7 days. 14 capsule Bing Neighbors, NP   carbamide peroxide (DEBROX) 6.5 % OTIC solution Place 5 drops into both ears 2 (two) times daily. 15 mL Bing Neighbors, NP      PDMP not reviewed this encounter.   Bing Neighbors, NP 03/21/23 1415

## 2023-03-21 NOTE — Discharge Instructions (Addendum)
Your urine is normal.  Urine pregnancy is negative. Take over the counter Ibuprofen for body and abdominal cramping. Earwax in your ears is likely causing the changes in your hearing.  I am sending over ear drop to loosen wax as the nurse was unable to get all of the wax from your ears due to discomfort you were experiencing. Use drops to loosen wax in your ears. Recommend follow-up with ENT if problems continues.

## 2023-04-16 ENCOUNTER — Encounter (HOSPITAL_BASED_OUTPATIENT_CLINIC_OR_DEPARTMENT_OTHER): Payer: Self-pay

## 2023-04-16 ENCOUNTER — Emergency Department (HOSPITAL_BASED_OUTPATIENT_CLINIC_OR_DEPARTMENT_OTHER)
Admission: EM | Admit: 2023-04-16 | Discharge: 2023-04-17 | Disposition: A | Discharge status: Home / Self Care | Payer: MEDICAID | Attending: Emergency Medicine | Admitting: Emergency Medicine

## 2023-04-16 DIAGNOSIS — H6123 Impacted cerumen, bilateral: Secondary | ICD-10-CM | POA: Diagnosis not present

## 2023-04-16 DIAGNOSIS — R519 Headache, unspecified: Secondary | ICD-10-CM | POA: Diagnosis present

## 2023-04-16 MED ORDER — IBUPROFEN 800 MG PO TABS: 800.0000 mg | ORAL_TABLET | Freq: Once | ORAL | Status: DC

## 2023-04-16 MED ORDER — AMOXICILLIN-POT CLAVULANATE 875-125 MG PO TABS: 1.0000 | ORAL_TABLET | Freq: Two times a day (BID) | ORAL | 0 refills | Status: DC

## 2023-04-16 MED ORDER — IBUPROFEN 800 MG PO TABS
800.0000 mg | ORAL_TABLET | Freq: Once | ORAL | Status: AC
  Administered 2023-04-17: 800 mg via ORAL
  Filled 2023-04-16: qty 1

## 2023-04-16 MED ORDER — AMOXICILLIN-POT CLAVULANATE 875-125 MG PO TABS
1.0000 | ORAL_TABLET | Freq: Once | ORAL | Status: AC
  Administered 2023-04-16: 1 via ORAL
  Filled 2023-04-16: qty 1

## 2023-04-16 MED ORDER — IBUPROFEN 600 MG PO TABS: 600.0000 mg | ORAL_TABLET | Freq: Four times a day (QID) | ORAL | 0 refills | Status: DC | PRN

## 2023-04-16 NOTE — ED Provider Notes (Signed)
[place] EMERGENCY DEPARTMENT AT [facility] Provider Note   CSN: [id number] Arrival date & time: 04/16/23  1644     History  Chief Complaint  Patient presents with   Ear Pain    Kathryn Peters is a 27 y.o. female.  Patient with history of cerumen impaction presents to the emergency department for several days of right sided facial pain and hearing changes.  Symptoms are consistent with previous cerumen impaction, per patient and family member.  Patient has had some dental issues as well and is due for an extraction.  Pain radiates to the right jaw and mouth without significant swelling.  No difficulty breathing or swallowing.  Family gave a leftover amoxicillin yesterday.  Sleeping has been difficult due to increasing pain.       Home Medications Prior to Admission medications   Medication Sig Start Date End Date Taking? Authorizing Provider  albuterol (PROVENTIL HFA;VENTOLIN HFA) 108 (90 Base) MCG/ACT inhaler Inhale 2 puffs into the lungs every 4 (four) hours as needed for wheezing or shortness of breath. 10/07/17 11/06/17  Muthersbaugh, Dahlia Client, PA-C  benzonatate (TESSALON) 100 MG capsule Take 1 capsule (100 mg total) by mouth 3 (three) times daily as needed for cough. 11/24/22   Zenia Resides, MD  carbamide peroxide (DEBROX) 6.5 % OTIC solution Place 5 drops into both ears 2 (two) times daily. 03/21/23   Bing Neighbors, NP      Allergies    Prunus persica, Food, and Pineapple    Review of Systems   Review of Systems  Physical Exam Updated Vital Signs BP (!) 146/97   Pulse 77   Temp 98.8 F (37.1 C) (Oral)   Resp 18   Ht 5\' 4"  (1.626 m)   Wt 94.8 kg   LMP 04/06/2023   SpO2 98%   BMI 35.87 kg/m  Physical Exam Vitals and nursing note reviewed.  Constitutional:      Appearance: She is well-developed.  HENT:     Head: Normocephalic and atraumatic.     Jaw: No trismus.     Right Ear: There is impacted cerumen.     Left Ear: There is  impacted cerumen.     Ears:     Comments: Very narrow ear canals bilaterally with cerumen impaction    Nose: Nose normal. No mucosal edema or rhinorrhea.     Mouth/Throat:     Mouth: Mucous membranes are moist. Mucous membranes are not dry. No oral lesions.     Pharynx: Uvula midline. No oropharyngeal exudate, posterior oropharyngeal erythema or uvula swelling.     Tonsils: No tonsillar abscesses.     Comments: I do not palpate or visualize any significant dental infections.  Patient does have some mild tenderness over the lateral gums on the right side. Eyes:     General:        Right eye: No discharge.        Left eye: No discharge.     Conjunctiva/sclera: Conjunctivae normal.  Cardiovascular:     Rate and Rhythm: Normal rate.  Pulmonary:     Effort: Pulmonary effort is normal.  Abdominal:     Palpations: Abdomen is soft.     Tenderness: There is no abdominal tenderness.  Musculoskeletal:     Cervical back: Normal range of motion and neck supple.  Lymphadenopathy:     Cervical: No cervical adenopathy.  Skin:    General: Skin is warm and dry.  Neurological:     Mental  Status: She is alert.  Psychiatric:        Mood and Affect: Mood normal.     ED Results / Procedures / Treatments   Labs (all labs ordered are listed, but only abnormal results are displayed) Labs Reviewed - No data to display  EKG None  Radiology No results found.  Procedures Procedures    Medications Ordered in ED Medications - No data to display  ED Course/ Medical Decision Making/ A&P    Patient seen and examined. History obtained directly from patient and family member at bedside.   Labs/EKG: None ordered  Imaging: None ordered  Medications/Fluids: None ordered  Most recent vital signs reviewed and are as follows: BP (!) 146/97   Pulse 77   Temp 98.8 F (37.1 C) (Oral)   Resp 18   Ht 5\' 4"  (1.626 m)   Wt 94.8 kg   LMP 04/06/2023   SpO2 98%   BMI 35.87 kg/m   Initial  impression: Right-sided facial pain, cerumen impaction likely etiology, cannot entirely rule out dental infection but no significant abscess noted on exam.  Will irrigate ears and reassess.  11:32 PM Reassessment performed. Patient appears stable.  Limited cerumen production with irrigation.  Most current vital signs reviewed and are as follows: BP (!) 155/77   Pulse (!) 59   Temp 98.8 F (37.1 C) (Oral)   Resp 20   Ht 5\' 4"  (1.626 m)   Wt 94.8 kg   LMP 04/06/2023   SpO2 100%   BMI 35.87 kg/m   Plan: Discharge to home.  ENT follow-up recommended.  Will give course of Augmentin with to cover for your infection and dental pain given significant facial pain.  Prescriptions written for: Augmentin  Other home care instructions discussed: Continued use of Debrox.   ED return instructions discussed: New or worsening symptoms.  Follow-up instructions discussed: Patient encouraged to follow-up with ENT in 3-5 days.                           Medical Decision Making Risk Prescription drug management.   Patient with cerumen impaction but also right-sided facial pain with some subjective swelling.  No concern for deep space infection in the face or neck.  Do not feel patient requires advanced imaging at this time.  She will benefit from ENT follow-up.        Final Clinical Impression(s) / ED Diagnoses Final diagnoses:  Bilateral impacted cerumen    Rx / DC Orders ED Discharge Orders          Ordered    amoxicillin-clavulanate (AUGMENTIN) 875-125 MG tablet  Every 12 hours        04/16/23 2325              Renne Crigler, PA-C 04/16/23 2337    Virgina Norfolk, DO 04/17/23 1549

## 2023-04-16 NOTE — ED Triage Notes (Signed)
Pt c/o right ear pain that goes to her jaw and tongue x 1 week. Pt states she hasn't been able to sleep due to pain.

## 2023-04-16 NOTE — Discharge Instructions (Signed)
Please read and follow all provided instructions.  Your diagnoses today include:  1. Bilateral impacted cerumen    Tests performed today include: Vital signs. See below for your results today.   Medications prescribed:  Augmentin - antibiotic  You have been prescribed an antibiotic medicine: take the entire course of medicine even if you are feeling better. Stopping early can cause the antibiotic not to work.  Take any prescribed medications only as directed.  Home care instructions:  Follow any educational materials contained in this packet.  BE VERY CAREFUL not to take multiple medicines containing Tylenol (also called acetaminophen). Doing so can lead to an overdose which can damage your liver and cause liver failure and possibly death.   Follow-up instructions: Please follow-up with your ENT or referral listed in the next 3-5 days for further evaluation of your symptoms.   Return instructions:  Please return to the Emergency Department if you experience worsening symptoms.  Please return if you have any other emergent concerns.  Additional Information:  Your vital signs today were: BP (!) 155/77   Pulse (!) 59   Temp 98.8 F (37.1 C) (Oral)   Resp 20   Ht 5\' 4"  (1.626 m)   Wt 94.8 kg   LMP 04/06/2023   SpO2 100%   BMI 35.87 kg/m  If your blood pressure (BP) was elevated above 135/85 this visit, please have this repeated by your doctor within one month. --------------

## 2023-04-17 NOTE — ED Notes (Signed)
Attempted to irrigate right ear to remove and soften ear wax. Very little wax was removed with irrigation.

## 2023-06-19 ENCOUNTER — Emergency Department (HOSPITAL_COMMUNITY)
Admission: EM | Admit: 2023-06-19 | Discharge: 2023-06-19 | Disposition: A | Discharge status: Home / Self Care | Payer: MEDICAID | Attending: Emergency Medicine | Admitting: Emergency Medicine

## 2023-06-19 ENCOUNTER — Emergency Department (HOSPITAL_COMMUNITY): Payer: MEDICAID

## 2023-06-19 ENCOUNTER — Encounter (HOSPITAL_COMMUNITY): Payer: Self-pay

## 2023-06-19 ENCOUNTER — Other Ambulatory Visit: Payer: Self-pay

## 2023-06-19 DIAGNOSIS — R569 Unspecified convulsions: Secondary | ICD-10-CM | POA: Insufficient documentation

## 2023-06-19 LAB — COMPREHENSIVE METABOLIC PANEL
ALT: 16 U/L (ref 0–44)
AST: 24 U/L (ref 15–41)
Albumin: 3.5 g/dL (ref 3.5–5.0)
Alkaline Phosphatase: 76 U/L (ref 38–126)
Anion gap: 10 (ref 5–15)
BUN: 9 mg/dL (ref 6–20)
CO2: 23 mmol/L (ref 22–32)
Calcium: 8.5 mg/dL — ABNORMAL LOW (ref 8.9–10.3)
Chloride: 105 mmol/L (ref 98–111)
Creatinine, Ser: 0.91 mg/dL (ref 0.44–1.00)
GFR, Estimated: >60 mL/min (ref >60)
Glucose, Bld: 89 mg/dL (ref 70–99)
Potassium: 3.7 mmol/L (ref 3.5–5.1)
Sodium: 138 mmol/L (ref 135–145)
Total Bilirubin: 0.4 mg/dL (ref 0.3–1.2)
Total Protein: 7.1 g/dL (ref 6.5–8.1)

## 2023-06-19 LAB — CBC WITH DIFFERENTIAL/PLATELET
Abs Immature Granulocytes: 0.06 10*3/uL (ref 0.00–0.07)
Basophils Absolute: 0.1 10*3/uL (ref 0.0–0.1)
Basophils Relative: 0 %
Eosinophils Absolute: 0.1 10*3/uL (ref 0.0–0.5)
Eosinophils Relative: 1 %
HCT: 38.8 % (ref 36.0–46.0)
Hemoglobin: 12.3 g/dL (ref 12.0–15.0)
Immature Granulocytes: 1 %
Lymphocytes Relative: 18 %
Lymphs Abs: 2.3 10*3/uL (ref 0.7–4.0)
MCH: 28.1 pg (ref 26.0–34.0)
MCHC: 31.7 g/dL (ref 30.0–36.0)
MCV: 88.8 fL (ref 80.0–100.0)
Monocytes Absolute: 0.8 10*3/uL (ref 0.1–1.0)
Monocytes Relative: 6 %
Neutro Abs: 9.5 10*3/uL — ABNORMAL HIGH (ref 1.7–7.7)
Neutrophils Relative %: 74 %
Platelets: 164 10*3/uL (ref 150–400)
RBC: 4.37 MIL/uL (ref 3.87–5.11)
RDW: 13.7 % (ref 11.5–15.5)
WBC: 12.8 10*3/uL — ABNORMAL HIGH (ref 4.0–10.5)
nRBC: 0 % (ref 0.0–0.2)

## 2023-06-19 LAB — RAPID URINE DRUG SCREEN, HOSP PERFORMED
Amphetamines: NOT DETECTED
Barbiturates: NOT DETECTED
Benzodiazepines: NOT DETECTED
Cocaine: NOT DETECTED
Opiates: NOT DETECTED
Tetrahydrocannabinol: NOT DETECTED

## 2023-06-19 LAB — HCG, SERUM, QUALITATIVE: Preg, Serum: NEGATIVE

## 2023-06-19 LAB — ETHANOL: Alcohol, Ethyl (B): <10 mg/dL (ref <10)

## 2023-06-19 MED ORDER — LEVETIRACETAM 500 MG PO TABS: 500.0000 mg | ORAL_TABLET | Freq: Two times a day (BID) | ORAL | 2 refills | Status: DC

## 2023-06-19 MED ORDER — LEVETIRACETAM 500 MG PO TABS
500.0000 mg | ORAL_TABLET | Freq: Once | ORAL | Status: AC
  Administered 2023-06-19: 500 mg via ORAL
  Filled 2023-06-19: qty 1

## 2023-06-19 NOTE — ED Triage Notes (Signed)
Patient bib [facility] from home after a witnessed seizure by her mother. EMS reports she yelled for her mom and mom witnessed the seizure and lowered her to the floor and the seizure lasted  for approximately a minute. Patient does not have a history of seizures the only seizure she's had was a febrile seizure as an infant. Patient reports to EMS she has been feeling funny for a month. She was postictal on EMS arrival to scene but is now A&Ox4.   PMH: Digeorge syndrome

## 2023-06-19 NOTE — ED Provider Notes (Signed)
[place] EMERGENCY DEPARTMENT AT [facility] Provider Note   CSN: [id number] Arrival date & time: 06/19/23  1733     History {Add pertinent medical, surgical, social history, OB history to HPI:1} No chief complaint on file.   Kathryn Peters is a 27 y.o. female history of DiGeorge's syndrome, infant febrile seizure presents today for evaluation of seizure.  Patient had a 1 minute generalized tonic clonic seizure witnessed by her mom at home about an hour ago.  She was postictal when EMS arrived.  She does not take any medication for seizures.  HPI  Past Medical History:  Diagnosis Date   Di George's syndrome [facility])    Past Surgical History:  Procedure Laterality Date   TONSILLECTOMY       Home Medications Prior to Admission medications   Medication Sig Start Date End Date Taking? Authorizing Provider  albuterol (PROVENTIL HFA;VENTOLIN HFA) 108 (90 Base) MCG/ACT inhaler Inhale 2 puffs into the lungs every 4 (four) hours as needed for wheezing or shortness of breath. 10/07/17 11/06/17  Muthersbaugh, Dahlia Client, PA-C  amoxicillin-clavulanate (AUGMENTIN) 875-125 MG tablet Take 1 tablet by mouth every 12 (twelve) hours. 04/16/23   Renne Crigler, PA-C  benzonatate (TESSALON) 100 MG capsule Take 1 capsule (100 mg total) by mouth 3 (three) times daily as needed for cough. 11/24/22   Zenia Resides, MD  carbamide peroxide (DEBROX) 6.5 % OTIC solution Place 5 drops into both ears 2 (two) times daily. 03/21/23   Bing Neighbors, NP      Allergies    Prunus persica, Food, and Pineapple    Review of Systems   Review of Systems Negative except as per HPI.  Physical Exam Updated Vital Signs There were no vitals taken for this visit. Physical Exam Vitals and nursing note reviewed.  Constitutional:      Appearance: Normal appearance.  HENT:     Head: Normocephalic and atraumatic.     Mouth/Throat:     Mouth: Mucous membranes are moist.  Eyes:     General: No  scleral icterus. Cardiovascular:     Rate and Rhythm: Normal rate and regular rhythm.     Pulses: Normal pulses.     Heart sounds: Normal heart sounds.  Pulmonary:     Effort: Pulmonary effort is normal.     Breath sounds: Normal breath sounds.  Abdominal:     General: Abdomen is flat.     Palpations: Abdomen is soft.     Tenderness: There is no abdominal tenderness.  Musculoskeletal:        General: No deformity.  Skin:    General: Skin is warm.     Findings: No rash.  Neurological:     General: No focal deficit present.     Mental Status: She is alert.     Comments: Cranial nerves II through XII intact. Intact sensation to light touch in all 4 extremities. 5/5 strength in all 4 extremities. Intact finger-to-nose and heel-to-shin of all 4 extremities. No visual field cuts. No neglect noted. No aphasia noted.   Psychiatric:        Mood and Affect: Mood normal.     ED Results / Procedures / Treatments   Labs (all labs ordered are listed, but only abnormal results are displayed) Labs Reviewed - No data to display  EKG None  Radiology No results found.  Procedures Procedures  {Document cardiac monitor, telemetry assessment procedure when appropriate:1}  Medications Ordered in ED Medications - No data  to display  ED Course/ Medical Decision Making/ A&P   {   Click here for ABCD2, HEART and other calculatorsREFRESH Note before signing :1}                              Medical Decision Making Amount and/or Complexity of Data Reviewed Labs: ordered. Radiology: ordered.  Risk Prescription drug management.   This patient presents to the ED for seizure, this involves an extensive number of treatment options, and is a complaint that carries with a high risk of complications and morbidity.  The differential diagnosis includes ***.  This is not an exhaustive list.  Lab tests: I ordered and personally interpreted labs.  The pertinent results include: WBC unremarkable.  Hbg unremarkable. Platelets unremarkable. Electrolytes unremarkable. BUN, creatinine unremarkable. ***  Imaging studies: I ordered imaging studies, personally reviewed, interpreted imaging and agree with the radiologist's interpretations. The results include: ***   Problem list/ ED course/ Critical interventions/ Medical management: HPI: See above Vital signs ***within normal range and stable throughout visit. Laboratory/imaging studies significant for: See above. On physical examination, patient is afebrile and appears in no acute distress. *** I have reviewed the patient home medicines and have made adjustments as needed.  Cardiac monitoring/EKG: The patient was maintained on a cardiac monitor.  I personally reviewed and interpreted the cardiac monitor which showed an underlying rhythm of: sinus rhythm.  Additional history obtained: External records from outside source obtained and reviewed including: Chart review including previous notes, labs, imaging.  Consultations obtained:  Disposition Continued outpatient therapy. Follow-up with PCP recommended for reevaluation of symptoms. Treatment plan discussed with patient.  Pt acknowledged understanding was agreeable to the plan. Worrisome signs and symptoms were discussed with patient, and patient acknowledged understanding to return to the ED if they noticed these signs and symptoms. Patient was stable upon discharge.   This chart was dictated using voice recognition software.  Despite best efforts to proofread,  errors can occur which can change the documentation meaning.    {Document critical care time when appropriate:1} {Document review of labs and clinical decision tools ie heart score, Chads2Vasc2 etc:1}  {Document your independent review of radiology images, and any outside records:1} {Document your discussion with family members, caretakers, and with consultants:1} {Document social determinants of health affecting pt's  care:1} {Document your decision making why or why not admission, treatments were needed:1} Final Clinical Impression(s) / ED Diagnoses Final diagnoses:  None    Rx / DC Orders ED Discharge Orders     None

## 2023-06-19 NOTE — ED Notes (Signed)
Patient transported to CT 

## 2023-06-19 NOTE — Discharge Instructions (Addendum)
Please take your medications as prescribed.  Refer to the seizure handout for detailed instruction on how to care for patient with seizure. I recommend close follow-up with PCP for reevaluation.  Please do not hesitate to return to emergency department if worrisome signs symptoms we discussed become apparent.

## 2023-06-20 ENCOUNTER — Encounter: Payer: Self-pay | Admitting: Neurology

## 2023-07-24 ENCOUNTER — Other Ambulatory Visit: Payer: Self-pay

## 2023-07-24 ENCOUNTER — Emergency Department (HOSPITAL_COMMUNITY)
Admission: EM | Admit: 2023-07-24 | Discharge: 2023-07-24 | Disposition: A | Discharge status: Home / Self Care | Payer: MEDICAID | Attending: Emergency Medicine | Admitting: Emergency Medicine

## 2023-07-24 ENCOUNTER — Encounter (HOSPITAL_COMMUNITY): Payer: Self-pay

## 2023-07-24 DIAGNOSIS — R569 Unspecified convulsions: Secondary | ICD-10-CM | POA: Diagnosis present

## 2023-07-24 DIAGNOSIS — R739 Hyperglycemia, unspecified: Secondary | ICD-10-CM

## 2023-07-24 LAB — CBC
HCT: 43.3 % (ref 36.0–46.0)
Hemoglobin: 13.2 g/dL (ref 12.0–15.0)
MCH: 27.5 pg (ref 26.0–34.0)
MCHC: 30.5 g/dL (ref 30.0–36.0)
MCV: 90.2 fL (ref 80.0–100.0)
Platelets: 172 10*3/uL (ref 150–400)
RBC: 4.8 MIL/uL (ref 3.87–5.11)
RDW: 13.6 % (ref 11.5–15.5)
WBC: 12.1 10*3/uL — ABNORMAL HIGH (ref 4.0–10.5)
nRBC: 0 % (ref 0.0–0.2)

## 2023-07-24 LAB — MAGNESIUM: Magnesium: 2.1 mg/dL (ref 1.7–2.4)

## 2023-07-24 LAB — URINALYSIS, ROUTINE W REFLEX MICROSCOPIC
Bacteria, UA: NONE SEEN
Bilirubin Urine: NEGATIVE
Glucose, UA: NEGATIVE mg/dL
Hgb urine dipstick: NEGATIVE
Ketones, ur: NEGATIVE mg/dL
Leukocytes,Ua: NEGATIVE
Nitrite: NEGATIVE
Protein, ur: 100 mg/dL — AB
Specific Gravity, Urine: 1.018 (ref 1.005–1.030)
pH: 7 (ref 5.0–8.0)

## 2023-07-24 LAB — BASIC METABOLIC PANEL
Anion gap: 8 (ref 5–15)
BUN: 11 mg/dL (ref 6–20)
CO2: 22 mmol/L (ref 22–32)
Calcium: 8.2 mg/dL — ABNORMAL LOW (ref 8.9–10.3)
Chloride: 105 mmol/L (ref 98–111)
Creatinine, Ser: 0.78 mg/dL (ref 0.44–1.00)
GFR, Estimated: >60 mL/min (ref >60)
Glucose, Bld: 106 mg/dL — ABNORMAL HIGH (ref 70–99)
Potassium: 4 mmol/L (ref 3.5–5.1)
Sodium: 135 mmol/L (ref 135–145)

## 2023-07-24 LAB — HCG, SERUM, QUALITATIVE: Preg, Serum: NEGATIVE

## 2023-07-24 MED ORDER — LEVETIRACETAM IN NACL 1000 MG/100ML IV SOLN
1000.0000 mg | Freq: Once | INTRAVENOUS | Status: AC
  Administered 2023-07-24: 1000 mg via INTRAVENOUS
  Filled 2023-07-24: qty 100

## 2023-07-24 MED ORDER — OYSTER SHELL CALCIUM/D3 500-5 MG-MCG PO TABS: 1.0000 | ORAL_TABLET | Freq: Two times a day (BID) | ORAL | 0 refills | Status: DC

## 2023-07-24 NOTE — Discharge Instructions (Signed)
Follow-up with neurology as scheduled on October 21.  Start taking your Keppra as directed 500 mg twice a day.  Return for any new or worse symptoms.

## 2023-07-24 NOTE — ED Triage Notes (Signed)
Pt presents via EMS c/o witnessed seizure at home. Reports has hx of previous seizure x1 and prescribed anti seizure medication but reports not taken over the last few days due to "makes me feel funny". Reports has not seen a neurologist for outpatient follow-up. Pt alert at this time, Unable to give year/month upon request.

## 2023-07-24 NOTE — ED Provider Notes (Signed)
Care assumed from Dr. Deretha Emory, patient with seizure who was noncompliant with anticonvulsants. She is pending BMP, magnesium.  Basic metabolic panel is significant for mild hypocalcemia and borderline elevated random glucose level which will need to be followed as an outpatient.  Since she has been having seizures, I feel it is important to optimize her calcium levels I am discharging her with a prescription for oral calcium.  She is to keep her appointment with neurology.  With anticonvulsants was stressed.   Dione Booze, MD 07/24/23 [id number]

## 2023-07-24 NOTE — ED Notes (Signed)
Family at bedside. 

## 2023-07-24 NOTE — ED Provider Notes (Addendum)
[place] EMERGENCY DEPARTMENT AT [facility] Provider Note   CSN: [id number] Arrival date & time: 07/24/23  2057     History  Chief Complaint  Patient presents with   Seizures    Kathryn Peters is a 27 y.o. female.  Patient with witnessed seizure tonight at around 2000.  Lasted maybe about 3 minutes.  Sounds as if it was generalized seizure tonic-clonic.  Patient had a postictal period patient now back to normal.  Patient was seen September 8 at [facility] emergency department for seizure.  Patient was treated with oral Keppra.  Head CT done at that time without any acute findings.  Patient was fine earlier today.  And patient felt fine yesterday.  Patient has an appointment with neurology for the first time on October 21.  Patient has a history of DiGeorge syndrome but has never had seizures before no family history of seizures.  The event that occurred on September 8 was the first time for seizure that they know up with mother's been suspicious that maybe they have occurred during the night.  Patient has not been compliant with her Keppra.  She does not like taking it.  Patient denies having any fevers any upper respiratory symptoms any abdominal pain any chest pain any shortness of breath any rash.  Any dysuria.  Patient did not bite her tongue no incontinence this time.  But did bite her lower lip.  No active bleeding.       Home Medications Prior to Admission medications   Medication Sig Start Date End Date Taking? Authorizing Provider  albuterol (PROVENTIL HFA;VENTOLIN HFA) 108 (90 Base) MCG/ACT inhaler Inhale 2 puffs into the lungs every 4 (four) hours as needed for wheezing or shortness of breath. 10/07/17 11/06/17  Muthersbaugh, Dahlia Client, PA-C  amoxicillin-clavulanate (AUGMENTIN) 875-125 MG tablet Take 1 tablet by mouth every 12 (twelve) hours. 04/16/23   Renne Crigler, PA-C  benzonatate (TESSALON) 100 MG capsule Take 1 capsule (100 mg total) by mouth 3 (three)  times daily as needed for cough. 11/24/22   Zenia Resides, MD  carbamide peroxide (DEBROX) 6.5 % OTIC solution Place 5 drops into both ears 2 (two) times daily. 03/21/23   Bing Neighbors, NP  levETIRAcetam (KEPPRA) 500 MG tablet Take 1 tablet (500 mg total) by mouth 2 (two) times daily. 06/19/23 08/03/23  Jeanelle Malling, PA      Allergies    Prunus persica, Food, and Pineapple    Review of Systems   Review of Systems  Constitutional:  Negative for chills and fever.  HENT:  Negative for ear pain and sore throat.   Eyes:  Negative for pain and visual disturbance.  Respiratory:  Negative for cough and shortness of breath.   Cardiovascular:  Negative for chest pain and palpitations.  Gastrointestinal:  Negative for abdominal pain and vomiting.  Genitourinary:  Negative for dysuria and hematuria.  Musculoskeletal:  Negative for arthralgias and back pain.  Skin:  Negative for color change and rash.  Neurological:  Positive for seizures. Negative for syncope.  All other systems reviewed and are negative.   Physical Exam Updated Vital Signs BP (!) 147/97   Pulse (!) 121   Temp 100 F (37.8 C) (Oral)   Resp 18   Ht 1.626 m (5\' 4" )   Wt 95.3 kg   LMP 06/29/2023 (Approximate)   SpO2 100%   BMI 36.05 kg/m  Physical Exam Vitals and nursing note reviewed.  Constitutional:  General: She is not in acute distress.    Appearance: Normal appearance. She is well-developed.  HENT:     Head: Normocephalic and atraumatic.     Mouth/Throat:     Mouth: Mucous membranes are moist.  Eyes:     Extraocular Movements: Extraocular movements intact.     Conjunctiva/sclera: Conjunctivae normal.     Pupils: Pupils are equal, round, and reactive to light.  Cardiovascular:     Rate and Rhythm: Normal rate and regular rhythm.     Heart sounds: No murmur heard. Pulmonary:     Effort: Pulmonary effort is normal. No respiratory distress.     Breath sounds: Normal breath sounds.  Abdominal:      Palpations: Abdomen is soft.     Tenderness: There is no abdominal tenderness.  Musculoskeletal:        General: No swelling.     Cervical back: Normal range of motion and neck supple. No rigidity.  Skin:    General: Skin is warm and dry.     Capillary Refill: Capillary refill takes less than 2 seconds.  Neurological:     General: No focal deficit present.     Mental Status: She is alert and oriented to person, place, and time.     Cranial Nerves: No cranial nerve deficit.  Psychiatric:        Mood and Affect: Mood normal.     ED Results / Procedures / Treatments   Labs (all labs ordered are listed, but only abnormal results are displayed) Labs Reviewed  CBC - Abnormal; Notable for the following components:      Result Value   WBC 12.1 (*)    All other components within normal limits  HCG, SERUM, QUALITATIVE  BASIC METABOLIC PANEL  MAGNESIUM  URINALYSIS, ROUTINE W REFLEX MICROSCOPIC    EKG None  Radiology No results found.  Procedures Procedures    Medications Ordered in ED Medications  levETIRAcetam (KEPPRA) IVPB 1000 mg/100 mL premix (1,000 mg Intravenous New Bag/Given 07/24/23 2209)    ED Course/ Medical Decision Making/ A&P                                 Medical Decision Making Amount and/or Complexity of Data Reviewed Labs: ordered.  Risk Prescription drug management.   Patient currently very stable.  CBC white count came back at 12.1 hemoglobin 13.2.  Platelets 172.  Qualitative hCG pending patient metabolic panel and magnesium is pending.  Patient does not need a repeat head CT.  Will give IV load of Keppra.  Patient has follow-up with neurology on October 21.  Patient's pregnancy test negative.  Quantitative hCG is negative.  Nurses got a call and see about her basic metabolic panel and magnesium.   Final Clinical Impression(s) / ED Diagnoses Final diagnoses:  Seizure-like activity [facility])    Rx / DC Orders ED Discharge Orders      None         Vanetta Mulders, MD 07/24/23 2228    Vanetta Mulders, MD 07/24/23 2255    Vanetta Mulders, MD 07/24/23 2302

## 2023-07-25 ENCOUNTER — Encounter: Payer: Self-pay | Admitting: Neurology

## 2023-08-01 ENCOUNTER — Encounter: Payer: Self-pay | Admitting: Neurology

## 2023-08-01 ENCOUNTER — Ambulatory Visit: Payer: MEDICAID | Admitting: Neurology

## 2023-08-01 VITALS — BP 114/80 | HR 94 | Ht 64.0 in | Wt 217.8 lb

## 2023-08-01 DIAGNOSIS — G40909 Epilepsy, unspecified, not intractable, without status epilepticus: Secondary | ICD-10-CM

## 2023-08-01 MED ORDER — LAMOTRIGINE 100 MG PO TABS: ORAL_TABLET | ORAL | 6 refills | Status: DC

## 2023-08-01 MED ORDER — LEVETIRACETAM 500 MG PO TABS: 500.0000 mg | ORAL_TABLET | Freq: Two times a day (BID) | ORAL | 1 refills | Status: DC

## 2023-08-01 MED ORDER — LAMOTRIGINE 25 MG PO TABS: ORAL_TABLET | ORAL | 0 refills | Status: DC

## 2023-08-01 NOTE — Patient Instructions (Addendum)
Good to meet you!  Schedule 1-hour EEG. We will call with results so we can decide about Oculus use  2. Schedule MRI brain with and without contrast  3. Start calcium supplements and follow-up with Dr. Ellamae Sia or Dr. Parke Simmers to monitor calcium levels  4. Start Lamotrigine 25mg  tablet: take 1 tablet twice a day for 2 weeks, then increase to 2 tablets twice a day for 2 weeks. After finishing this bottle, start Lamotrigine 100mg  tablet: take 1 tablet twice a day  5. Continue Keppra (Levetiracetam) 500mg  twice a day for another 4 weeks as we start the Lamotrigine. Once taking Lamotrigine 100mg  twice a day, reduce Keppra to 1 tablet daily for 1 week, then stop Keppra.   6. A prescription for Embrace2 seizure detection watch provided. Here is the website for information: TourneyLocator.at  7. Follow-up in 6 weeks, call for any changes   Seizure Precautions: 1. If medication has been prescribed for you to prevent seizures, take it exactly as directed.  Do not stop taking the medicine without talking to your doctor first, even if you have not had a seizure in a long time.   2. Avoid activities in which a seizure would cause danger to yourself or to others.  Don't operate dangerous machinery, swim alone, or climb in high or dangerous places, such as on ladders, roofs, or girders.  Do not drive unless your doctor says you may.  3. If you have any warning that you may have a seizure, lay down in a safe place where you can't hurt yourself.    4.  No driving for 6 months from last seizure, as per [facility].   Please refer to the following link on the [employer]'s website for more information: http://www.epilepsyfoundation.org/answerplace/Social/driving/drivingu.cfm   5.  Maintain good sleep hygiene.  6.  Notify your neurology if you are planning pregnancy or if you become pregnant.  7.  Contact your doctor if you have any problems that may be  related to the medicine you are taking.  8.  Call 911 and bring the patient back to the ED if:        A.  The seizure lasts longer than 5 minutes.       B.  The patient doesn't awaken shortly after the seizure  C.  The patient has new problems such as difficulty seeing, speaking or moving  D.  The patient was injured during the seizure  E.  The patient has a temperature over 102 F (39C)  F.  The patient vomited and now is having trouble breathing

## 2023-08-01 NOTE — Progress Notes (Signed)
NEUROLOGY CONSULTATION NOTE  Raechel Dunkleberger MRN: [id number] DOB: Jul 26, 1996  Referring provider: Dr. Renaye Rakers Primary care provider: Dr. Renaye Rakers  Reason for consult:  seizures   Dear Dr Parke Simmers:  Thank you for your kind referral of Kathryn Peters for consultation of the above symptoms. Although her history is well known to you, please allow me to reiterate it for the purpose of our medical record. The patient was accompanied to the clinic by her mother who also provides collateral information. Records and images were personally reviewed where available.   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 Her mother is present to provide additional information. Records from Dr. Ellamae Sia at the [facility] were reviewed. She had one brief febrile seizure in infancy. She and her mother report that over the past 2 years, she has been reporting episodes of "absent" seizures and feeling like she is getting "electrocuted" on her head. She had reported these symptoms to Dr. Ellamae Sia in 11/2022, she feels like she has to stare and does not feel like talking, aware of what is happening around her, with "electric like feeling" lasting 10 seconds but can happen many times a day. Her mother recalls one episode while they were in [place] in 05/2023 where she was staring but snapped out of it quickly enough. On 06/19/2023, she alerted her mother that she felt kind of funny/jittery. She told her mother she smelled something burning. She ate then went to sleep within 15 minutes, then her mother heard her call and found her on the bed, then her face grimaced with mouth open followed by a convulsion lasting 5 minutes with head turned to the left, arms flexed. She turned ashen gray and bit her bottom lip. In hindsight, she may have had 2  nocturnal seizures in the past year, she woke up twice with urinary incontinence, most recently in the summer of 2024. She also bit her lip one of those times. She woke up on the floor feeling diffusely weak and sore. She was brought to the ER where bloodwork showed a WBC of 12.8, calcium 8.5. UDS and EtOH negative. I personally reviewed head CT without contrast which did not show any acute changes. She was discharged home on Levetiracetam 500mg  BID which made her drowsy. She stopped taking it for 3 days then had another seizure on 07/24/23. She was awake and felt something was off, she was running to alert family then fell and had a GTC. Bloodwork showed a WBC of 12.1, calcium 8.2. She was discharged home with instructions to take calcium supplements and restart Levetiracetam. She then had another seizure the next morning 07/25/23 again as she was running to alert family she was feeling something. She feels diffusely slow, no focal weakness. She continues to report the "electrocuted" feeling and staring where she has to rub something for it to stop, stating it can go all day. The Levetiracetam continues to cause a lot of drowsiness. She has not started calcium yet.   She denies any headaches. She reports a lot of dizziness/lightheadedness, mostly when standing. She has occasional difficulty swallowing and may choke. No diplopia, neck/back pain, focal numbness/tingling/weakness, myoclonic jerks, bowel/bladder dysfunction. Over the past 2 years, she has started having more sensitivity to bright lights and sounds. She has been working at a [employer] systems for the past 2 years  but the noise is getting to her. She reports that she had not been getting good sleep prior to the seizures, but now sleeps 10 hours a day. She has had boils on a monthly basis, around the time of her period. Her mother notes a lot of memory loss, a lot of saying "I don't know" in the past year. Her mother notes the vitiligo on the nasolabial folds  are new. She lives with her mother and grandmother. She does not drive. She admits to drinking alcohol socially (rare), none prior to the seizures. Mood is "sleepy."    Epilepsy Risk Factors:  She had a brief febrile seizure in infancy. 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 No history of CNS infections such as meningitis/encephalitis, significant traumatic brain injury, neurosurgical procedures, or family history of seizures.    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 60 tablet 0   doxycycline (VIBRA-TABS) 100 MG tablet Take 100 mg by mouth 2 (two) times daily.     levETIRAcetam (KEPPRA) 500 MG tablet Take 1 tablet (500 mg total) by mouth 2 (two) times daily. 30 tablet 2   metFORMIN (GLUCOPHAGE) 500 MG tablet Take 500 mg by mouth 2 (two) times daily with a meal.     carbamide peroxide (DEBROX) 6.5 % OTIC solution Place 5 drops into both ears 2 (two) times daily. (Patient not taking: Reported on 08/01/2023) 15 mL 0   No current facility-administered medications on file prior to visit.    ALLERGIES: Allergies  Allergen Reactions   Prunus Persica Swelling   Food Itching and Other (See Comments)    Pt states that she is allergic to strawberries, peaches   Pineapple Swelling and Other (See Comments)    Reaction:  Tongue swelling    FAMILY HISTORY: Family History  Problem Relation Age of Onset   Hypertension Father     SOCIAL HISTORY: Social History   Socioeconomic History   Marital status: Single    Spouse name: Not on file   Number of children: Not on file   Years of education: Not on file    Highest education level: Not on file  Occupational History   Not on file  Tobacco Use   Smoking status: Never   Smokeless tobacco: Never  Vaping Use   Vaping status: Never Used  Substance and Sexual Activity   Alcohol use: No   Drug use: No   Sexual activity: Not on file  Other Topics Concern   Not on file  Social History Narrative   Are you right handed or left handed? Right    Are you currently employed ? 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 No    Who lives with you? Family    What type of home do you live in: 1 story or 2 story?  2 story        Social Determinants of Health   Financial Resource Strain: Not on file  Food Insecurity: Not on file  Transportation Needs: Not on file  Physical Activity: Not on file  Stress: Not on file  Social Connections: Unknown (02/23/2022)   Received from [facility]   Social Network    Social Network: Not on file  Intimate Partner Violence: Unknown (01/15/2022)   Received from [facility], [facility]   HITS    Physically Hurt: Not on file    Insult or Talk Down To: Not on file    Threaten Physical Harm: Not on file    Scream or Curse: Not on file     PHYSICAL EXAM: Vitals:   08/01/23 0847  BP: 114/80  Pulse: 94  SpO2: 98%   General: No acute distress Head:  Normocephalic/atraumatic, vitiligo around nasolabial folds Skin/Extremities: No rash, no edema Neurological Exam: Mental status: alert and oriented to person, place, year. Month is September. No dysarthria or aphasia, Fund of knowledge is appropriate.  Recent and remote memory are intact, 3/3 delayed recall.  Attention and concentration are normal, 5/5 WORLD backwards.  Cranial nerves: CN I: not tested CN II: pupils equal, round, visual fields intact CN III, IV, VI:  full range of motion, no nystagmus, no ptosis CN V: facial sensation intact CN VII: upper and lower face symmetric CN VIII: hearing intact  to conversation Bulk & Tone: normal, no fasciculations. Motor: 5/5 throughout with no pronator drift. Sensation: intact to light touch, cold, pin, vibration sense.  No extinction to double simultaneous stimulation.  Romberg test negative Deep Tendon Reflexes: unable to elicit throughout Cerebellar: no incoordination on finger to nose, testing Gait: narrow-based and steady, able to tandem walk adequately. 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 She reports a 2 year history of "absent" seizures and "electrocution" feelings, then recently had 3 witnessed convulsive seizures. Her neurological exam is normal. Seizures can occur in the setting of 22q11.2 deletion syndrome, generally they are due to hypocalcemia, but can also occur in normocalcemic patients. Agree with calcium supplementation and monitoring calcium levels. MRI brain with and without contrast and 1-hour EEG will be ordered for seizure classification. She is having side effects on Levetiracetam, we discussed switching to Lamotrigine, side effects discussed. Start Lamotrigine 25mg  BID x 2 weeks, then increase to 50mg  BID x 2 weeks, then to 100mg  BID. Continue Levetiracetam 500mg  BID for now, she will start weaning off once on Lamotrigine 100mg  BID. She does not drive. Follow-up in 6 weeks, call for any changes.    Thank you for allowing me to participate in the care of this patient. Please do not hesitate to call for any questions or concerns.   Patrcia Dolly, M.D.  CC: Dr. Parke Simmers

## 2023-08-16 ENCOUNTER — Ambulatory Visit: Payer: MEDICAID | Admitting: Neurology

## 2023-08-16 DIAGNOSIS — G40909 Epilepsy, unspecified, not intractable, without status epilepticus: Secondary | ICD-10-CM

## 2023-08-16 NOTE — Progress Notes (Unsigned)
EEG complete - results pending 

## 2023-08-17 NOTE — Procedures (Signed)
ELECTROENCEPHALOGRAM REPORT  Date of Study: 08/16/2023  Patient's Name: Kathryn Peters MRN: [id number] Date of Birth: 1996-09-29  Referring Provider: Dr. Patrcia Dolly  Clinical History: This is a 27 year old woman with convulsions, staring, and "electric like feelings." EEG for classification.  Medications: Levetiracetam, Lamotrigine, Metformin  Technical Summary: A multichannel digital 1-hour EEG recording measured by the international 10-20 system with electrodes applied with paste and impedances below 5000 ohms performed in our laboratory with EKG monitoring in an awake and asleep patient.  Hyperventilation was not performed. Photic stimulation was performed.  The digital EEG was referentially recorded, reformatted, and digitally filtered in a variety of bipolar and referential montages for optimal display.    Description: The patient is awake and asleep during the recording.  During maximal wakefulness, there is a symmetric, medium voltage 10 Hz posterior dominant rhythm that attenuates with eye opening.  The record is symmetric.  During drowsiness and sleep, there is an increase in theta slowing of the background.  Vertex waves and symmetric sleep spindles were seen. Photic stimulation did not elicit any abnormalities.  There were no epileptiform discharges or electrographic seizures seen.    EKG lead was unremarkable.  Impression: This 1-hour awake and asleep EEG is normal.    Clinical Correlation: A normal EEG does not exclude a clinical diagnosis of epilepsy.  If further clinical questions remain, prolonged EEG may be helpful.  Clinical correlation is advised.   Patrcia Dolly, M.D.

## 2023-08-18 ENCOUNTER — Telehealth: Payer: Self-pay

## 2023-08-18 NOTE — Telephone Encounter (Signed)
-----   Message from Van Clines sent at 08/18/2023  9:51 AM EST ----- Pls let her know the EEG is normal, however it is not like a pregnancy test that is yes or no, just a snapshot of her brain waves. Continue seizure medications. I don't see MRI brain scheduled, pls have her schedule as discussed, thanks

## 2023-08-18 NOTE — Telephone Encounter (Signed)
Pt called an informed that EEG is normal, however it is not like a pregnancy test that is yes or no, just a snapshot of her brain waves. Continue seizure medications. Pt given the number to [facility] to get MRI scheduled

## 2023-08-21 ENCOUNTER — Other Ambulatory Visit: Payer: Self-pay | Admitting: Neurology

## 2023-08-30 ENCOUNTER — Other Ambulatory Visit: Payer: Self-pay | Admitting: Neurology

## 2023-09-11 ENCOUNTER — Encounter (HOSPITAL_COMMUNITY): Payer: Self-pay

## 2023-09-11 ENCOUNTER — Emergency Department (HOSPITAL_COMMUNITY)
Admission: EM | Admit: 2023-09-11 | Discharge: 2023-09-11 | Disposition: A | Discharge status: Home / Self Care | Payer: MEDICAID | Attending: Emergency Medicine | Admitting: Emergency Medicine

## 2023-09-11 ENCOUNTER — Other Ambulatory Visit: Payer: Self-pay

## 2023-09-11 DIAGNOSIS — R258 Other abnormal involuntary movements: Secondary | ICD-10-CM | POA: Diagnosis present

## 2023-09-11 DIAGNOSIS — R569 Unspecified convulsions: Secondary | ICD-10-CM

## 2023-09-11 LAB — I-STAT CHEM 8, ED
BUN: 12 mg/dL (ref 6–20)
Calcium, Ion: 1.12 mmol/L — ABNORMAL LOW (ref 1.15–1.40)
Chloride: 104 mmol/L (ref 98–111)
Creatinine, Ser: 0.8 mg/dL (ref 0.44–1.00)
Glucose, Bld: 96 mg/dL (ref 70–99)
HCT: 37 % (ref 36.0–46.0)
Hemoglobin: 12.6 g/dL (ref 12.0–15.0)
Potassium: 4 mmol/L (ref 3.5–5.1)
Sodium: 140 mmol/L (ref 135–145)
TCO2: 24 mmol/L (ref 22–32)

## 2023-09-11 LAB — HCG, SERUM, QUALITATIVE: Preg, Serum: NEGATIVE

## 2023-09-11 LAB — CBG MONITORING, ED: Glucose-Capillary: 94 mg/dL (ref 70–99)

## 2023-09-11 MED ORDER — LEVETIRACETAM 500 MG PO TABS: 500.0000 mg | ORAL_TABLET | Freq: Two times a day (BID) | ORAL | 0 refills | Status: DC

## 2023-09-11 MED ORDER — LEVETIRACETAM 500 MG PO TABS
1000.0000 mg | ORAL_TABLET | Freq: Once | ORAL | Status: AC
  Administered 2023-09-11: 1000 mg via ORAL
  Filled 2023-09-11: qty 2

## 2023-09-11 NOTE — Discharge Instructions (Addendum)
Please take your Lamictal 2 tabs of 25 mg each in the morning and at night for 1 week. Take your Depakote 500 mg in the morning and at night After 1 week, increase your Lamictal to 1 tab of the 100 mg in the morning and at night You may then stop your Depakote as previously instructed by your neurologist Please follow-up with your neurologist on December 11 Seizure precautions remain in place Per [facility] statutes, patients with seizures are not allowed to drive until they have been seizure-free for six months. Use caution when using heavy equipment or power tools. Avoid working on ladders or at heights. Take showers instead of baths. Ensure the water temperature is not too high on the home water heater. Do not go swimming alone. Do not lock yourself in a room alone (i.e. bathroom). When caring for infants or small children, sit down when holding, feeding, or changing them to minimize risk of injury to the child in the event you have a seizure. Maintain good sleep hygiene. Avoid alcohol.    If Kathryn Peters has another seizure, call 911 and bring them back to the ED if:       A.  The seizure lasts longer than 5 minutes.            B.  The patient doesn't wake shortly after the seizure or has new problems such as difficulty seeing, speaking or moving following the seizure       C.  The patient was injured during the seizure       D.  The patient has a temperature over 102 F (39C)       E.  The patient vomited during the seizure and now is having trouble breathing

## 2023-09-11 NOTE — ED Provider Notes (Signed)
[place] EMERGENCY DEPARTMENT AT [facility] Provider Note   CSN: [id number] Arrival date & time: 09/11/23  1125     History  Chief Complaint  Patient presents with   Seizures    Kathryn Peters is a 27 y.o. female.  HPI 27 year old female history of DiGeorge's syndrome with recent diagnosis of new onset of seizures. Presents today with some focal type seizures.  History is obtained from patient and her mother.  She had full seizure workup in September.  She is being followed outpatient with neurology.  She was started on Lamictal and was to increase dosage over weeks and then stop Keppra.  However, she did not increase her Lamictal as prescribed and ran out of her Keppra.  She now is having some facial twitching intermittently that then resolves.  She and her mother voiced concern that this is due to not being on her Keppra.  She has not had any recent grand mal seizures.    Home Medications Prior to Admission medications   Medication Sig Start Date End Date Taking? Authorizing Provider  calcium-vitamin D (OSCAL WITH D) 500-5 MG-MCG tablet Take 1 tablet by mouth 2 (two) times daily. 07/24/23   Dione Booze, MD  carbamide peroxide (DEBROX) 6.5 % OTIC solution Place 5 drops into both ears 2 (two) times daily. Patient not taking: Reported on 08/01/2023 03/21/23   Bing Neighbors, NP  doxycycline (VIBRA-TABS) 100 MG tablet Take 100 mg by mouth 2 (two) times daily. 07/13/23   [provider]  lamoTRIgine (LAMICTAL) 100 MG tablet Once done with Lamotrigine 25mg  tablet prescription, start Lamotrigine 100mg : take 1 tablet twice a day 08/01/23   Van Clines, MD  lamoTRIgine (LAMICTAL) 25 MG tablet TAKE 1 TAB TWICE A DAY FOR 2 WEEKS, THEN TO 2 TABS TWICE A DAY FOR 2 WEEKS. AFTER THIS, START 100MG  08/22/23   Van Clines, MD  levETIRAcetam (KEPPRA) 500 MG tablet Take 1 tablet (500 mg total) by mouth 2 (two) times daily. 08/01/23 09/15/23  Van Clines, MD   metFORMIN (GLUCOPHAGE) 500 MG tablet Take 500 mg by mouth 2 (two) times daily with a meal.    [provider]      Allergies    Prunus persica, Food, and Pineapple    Review of Systems   Review of Systems  Physical Exam Updated Vital Signs BP (!) 132/93   Pulse 83   Temp 98.1 F (36.7 C) (Oral)   Resp 18   Ht 1.638 m (5' 4.5")   Wt 95.7 kg   SpO2 100%   BMI 35.66 kg/m  Physical Exam Vitals and nursing note reviewed.  Constitutional:      General: She is not in acute distress.    Appearance: She is well-developed.  HENT:     Head: Normocephalic and atraumatic.     Right Ear: External ear normal.     Left Ear: External ear normal.     Nose: Nose normal.  Eyes:     Conjunctiva/sclera: Conjunctivae normal.     Pupils: Pupils are equal, round, and reactive to light.  Pulmonary:     Effort: Pulmonary effort is normal.  Musculoskeletal:        General: Normal range of motion.     Cervical back: Normal range of motion and neck supple.  Skin:    General: Skin is warm and dry.  Neurological:     General: No focal deficit present.     Mental  Status: She is alert and oriented to person, place, and time.     Motor: No abnormal muscle tone.     Coordination: Coordination normal.  Psychiatric:        Behavior: Behavior normal.        Thought Content: Thought content normal.     ED Results / Procedures / Treatments   Labs (all labs ordered are listed, but only abnormal results are displayed) Labs Reviewed  I-STAT CHEM 8, ED - Abnormal; Notable for the following components:      Result Value   Calcium, Ion 1.12 (*)    All other components within normal limits  HCG, SERUM, QUALITATIVE  CBG MONITORING, ED    EKG None  Radiology No results found.  Procedures Procedures    Medications Ordered in ED Medications  levETIRAcetam (KEPPRA) tablet 1,000 mg (has no administration in time range)    ED Course/ Medical Decision Making/ A&P                                  Medical Decision Making Amount and/or Complexity of Data Reviewed Labs: ordered.  Risk Prescription drug management.   Patient with some facial twitching with recent history of new onset seizures.  She has not been taking medications correctly.  Plan to reinstitute her increase in Lamictal and will give her Keppra to use until she is back up to the dose that she is supposed to be at.  She does have follow-up in place with her neurologist on December 11.        Final Clinical Impression(s) / ED Diagnoses Final diagnoses:  Seizure-like activity [facility])    Rx / DC Orders ED Discharge Orders     None         Margarita Grizzle, MD 09/11/23 1318

## 2023-09-11 NOTE — ED Triage Notes (Signed)
Pt arrived POV from home c/o seizures that started in sept. Pt states her seizures come in waves and she has been feeling it in her face and upper body and she cannot sleep.

## 2023-09-12 ENCOUNTER — Telehealth: Payer: Self-pay | Admitting: Neurology

## 2023-09-12 NOTE — Telephone Encounter (Signed)
Pt's mother called in on 09/11/23 and left a message with the access nurse. Caller says daughter is continuing to have mild seizures. Caller says she wasn't taking her medication correctly. 911 came yesterday but didn't take her to the ED.  Full access nurse report is in Dr. Rosalyn Gess box

## 2023-09-12 NOTE — Telephone Encounter (Signed)
Pt's mother called in on 09/10/23 and left a message with the access nurse. The caller states that she has questions about her daughter's medication dosage. She has a burning sensation, numbness and tingling on one side of her face that started suddenly 15 minutes ago.   Full access nurse report is in Dr. Rosalyn Gess box

## 2023-09-12 NOTE — Telephone Encounter (Signed)
Pt c/o: seizure Missed medications?  Yes.   Sleep deprived?  Yes.   Alcohol intake?  No. Increased stress? No. Any change in medication color or shape? No. Any trigger? Stated that loud sounds.  Back to their usual baseline self?  Yes.  . If no, advise go to ER Current medications prescribed by Dr. Karel Jarvis: lamoTRIgine (LAMICTAL) 25 MG tablet  TAKE 1 TAB TWICE A DAY  she never titrated up  levETIRAcetam (KEPPRA) 500 MG tablet Take 1 tablet (500 mg total) by mouth 2 (two) times daily.

## 2023-09-14 NOTE — Telephone Encounter (Signed)
Pls have her increase Lamotrigine 25mg : take 2 tablets twice a day, continue Keppra. I will see her 12/11 as scheduled. thanks

## 2023-09-15 NOTE — Telephone Encounter (Signed)
Pt called informed that she needs to increase Lamotrigine 25mg : take 2 tablets twice a day, continue Keppra. we will see her 12/11 as scheduled Pt mother informed that she is not on the DPR they need to update the DPR at the appointment on 09/21/23

## 2023-09-21 ENCOUNTER — Encounter: Payer: Self-pay | Admitting: Neurology

## 2023-09-21 ENCOUNTER — Ambulatory Visit: Payer: MEDICAID | Admitting: Neurology

## 2023-09-21 VITALS — BP 119/84 | HR 104 | Ht 64.5 in | Wt 211.8 lb

## 2023-09-21 DIAGNOSIS — G40909 Epilepsy, unspecified, not intractable, without status epilepticus: Secondary | ICD-10-CM

## 2023-09-21 NOTE — Patient Instructions (Addendum)
Good to see you.  This is schedule for your medications:  Dec 11-15:  take Lamotrigine 25mg : 2 tablets twice a day Take Keppra 500mg : take 1 tablet twice a day  Dec 16-22: Take Lamotrigine 100mg : Take 1 tablet twice a day Take Keppra 500mg : Take 1 tablet twice a day  Dec 23-29: Take Lamotrigine 100mg : Take 1 tablet twice a day Reduce Keppra 500mg : Take 1 tablet every night  Dec 30: stop Keppra Continue Lamotrigine 100mg  twice a day   Proceed with brain MRI with and without contrast  Keep a calendar of your symptoms  Follow-up in February as scheduled, call for any changes

## 2023-09-21 NOTE — Progress Notes (Signed)
NEUROLOGY FOLLOW UP OFFICE NOTE  Kathryn Peters [id number] [birth year]  HISTORY OF PRESENT ILLNESS: I had the pleasure of seeing Kathryn Peters in follow-up in the neurology clinic on 09/21/2023.  The patient was last seen 7 weeks ago for seizures. She is again accompanied by her mother who helps supplement the history today.  Records and images were personally reviewed where available.  Her 1-hour EEG in 08/2023 was normal. MRI brain scheduled for 10/11/23. On her last visit, she was instructed to start Lamotrigine due to side effects on Levetiracetam. She was in the ER on 09/11/23 after another seizure, she had not increased Lamotrigine as instructed and ran out of Levetiracetam. She was having intermittent facial twitching in the ER. Her mother report they were small ones but they kept on coming. Ionized calcium was 1.12. She recently ran out of her calcium supplement. She was instructed to increase the Lamotrigine to 50mg  BID as prescribed and continue Levetiracetam 500mg  BID. Her mother notes she is very irritable. No headaches, dizziness, vision changes, focal numbness/tingling/weakness, no falls.   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 Her mother is present to provide additional information. Records from Dr. Ellamae Sia at the [facility] were reviewed. She had one brief febrile seizure in infancy. She and her mother report that over the past 2 years, she has been reporting episodes of "absent" seizures and feeling like she is getting "electrocuted" on her head. She had reported these symptoms to Dr. Ellamae Sia in 11/2022, she feels like she has to stare and does not feel like talking, aware of what is happening around her, with "electric like feeling" lasting 10 seconds but can happen many times a day. Her mother  recalls one episode while they were in [place] in 05/2023 where she was staring but snapped out of it quickly enough. On 06/19/2023, she alerted her mother that she felt kind of funny/jittery. She told her mother she smelled something burning. She ate then went to sleep within 15 minutes, then her mother heard her call and found her on the bed, then her face grimaced with mouth open followed by a convulsion lasting 5 minutes with head turned to the left, arms flexed. She turned ashen gray and bit her bottom lip. In hindsight, she may have had 2 nocturnal seizures in the past year, she woke up twice with urinary incontinence, most recently in the summer of 2024. She also bit her lip one of those times. She woke up on the floor feeling diffusely weak and sore. She was brought to the ER where bloodwork showed a WBC of 12.8, calcium 8.5. UDS and EtOH negative. I personally reviewed head CT without contrast which did not show any acute changes. She was discharged home on Levetiracetam 500mg  BID which made her drowsy. She stopped taking it for 3 days then had another seizure on 07/24/23. She was awake and felt something was off, she was running to alert family then fell and had a GTC. Bloodwork showed a WBC of 12.1, calcium 8.2. She was discharged home with instructions to take calcium supplements and restart Levetiracetam. She then had another seizure the next morning 07/25/23 again as she was running to alert family she was feeling something. She feels diffusely slow, no focal weakness. She continues to report the "electrocuted" feeling and staring where she has to rub  something for it to stop, stating it can go all day. The Levetiracetam continues to cause a lot of drowsiness. She has not started calcium yet.   She denies any headaches. She reports a lot of dizziness/lightheadedness, mostly when standing. She has occasional difficulty swallowing and may choke. No diplopia, neck/back pain, focal numbness/tingling/weakness,  myoclonic jerks, bowel/bladder dysfunction. Over the past 2 years, she has started having more sensitivity to bright lights and sounds. 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 She reports that she had not been getting good sleep prior to the seizures, but now sleeps 10 hours a day. She has had boils on a monthly basis, around the time of her period. Her mother notes a lot of memory loss, a lot of saying "I don't know" in the past year. Her mother notes the vitiligo on the nasolabial folds are new. She lives with her mother and grandmother. She does not drive. She admits to drinking alcohol socially (rare), none prior to the seizures. Mood is "sleepy."    Epilepsy Risk Factors:  She had a brief febrile seizure in infancy. 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 No history of CNS infections such as meningitis/encephalitis, significant traumatic brain injury, neurosurgical procedures, or family history of seizures.  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 60 tablet 0   carbamide peroxide (DEBROX) 6.5 % OTIC solution Place 5 drops into both ears 2 (two) times daily. 15 mL 0   lamoTRIgine (LAMICTAL) 25 MG tablet TAKE 1 TAB TWICE A DAY FOR 2 WEEKS, THEN TO 2 TABS TWICE A DAY FOR 2 WEEKS. AFTER THIS, START 100MG  (Patient taking differently: TAKE 2 tablets daily) 252 tablet 1   levETIRAcetam (KEPPRA) 500 MG tablet Take 1 tablet (500 mg total) by mouth 2 (two) times daily. 60 tablet 0   metFORMIN (GLUCOPHAGE) 500 MG tablet Take 500 mg by mouth 2 (two) times daily with a meal.     lamoTRIgine (LAMICTAL) 100 MG tablet Once done with  Lamotrigine 25mg  tablet prescription, start Lamotrigine 100mg : take 1 tablet twice a day (Patient not taking: Reported on 09/21/2023) 60 tablet 6   No current facility-administered medications on file prior to visit.    ALLERGIES: Allergies  Allergen Reactions   Prunus Persica Swelling   Food Itching and Other (See Comments)    Pt states that she is allergic to strawberries, peaches   Pineapple Swelling and Other (See Comments)    Reaction:  Tongue swelling    FAMILY HISTORY: Family History  Problem Relation Age of Onset   Hypertension Father     SOCIAL HISTORY: Social History   Socioeconomic History   Marital status: Single    Spouse name: Not on file   Number of children: Not on file   Years of education: Not on file   Highest education level: Not on file  Occupational History   Not on file  Tobacco Use   Smoking status: Never   Smokeless tobacco: Never  Vaping Use   Vaping status: Never Used  Substance and Sexual Activity   Alcohol use: No   Drug use:  No   Sexual activity: Not on file  Other Topics Concern   Not on file  Social History Narrative   Are you right handed or left handed? Right    Are you currently employed ? 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 No    Who lives with you? Family    What type of home do you live in: 1 story or 2 story?  2 story        Social Determinants of Corporate investment banker Strain: Not on file  Food Insecurity: Not on file  Transportation Needs: Not on file  Physical Activity: Not on file  Stress: Not on file  Social Connections: Unknown (02/23/2022)   Received from [facility]   Social Network    Social Network: Not on file  Intimate Partner Violence: Unknown (01/15/2022)   Received from [facility], [facility]   HITS    Physically Hurt: Not on file    Insult or Talk Down To: Not on file    Threaten Physical Harm: Not on file    Scream or  Curse: Not on file     PHYSICAL EXAM: Vitals:   09/21/23 0922  BP: 119/84  Pulse: (!) 104  SpO2: 96%   General: No acute distress Head:  Normocephalic/atraumatic Skin/Extremities: No rash, no edema Neurological Exam: alert and awake. No aphasia or dysarthria. Fund of knowledge is appropriate.   Attention and concentration are normal.   Cranial nerves: Pupils equal, round. Extraocular movements intact. No facial asymmetry.  Motor: Moves all extremities symmetrically at least anti-gravity x 4. Gait narrow-based and steady, no ataxia.   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 She reports a 2 year history of "absent" seizures and "electrocution" feelings, then recently had 3 witnessed convulsive seizures. Seizures can occur in the setting of 22q11.2 deletion syndrome, generally they are due to hypocalcemia, but can also occur in normocalcemic patients. Continue with calcium supplementation. She was in the ER recently for focal motor seizures (facial twitching). She had not increased Lamotrigine as instructed and was given instructions today for increase to 100mg  BID, then start weaning off Levetiracetam due to mood changes. Proceed with brain MRI as scheduled. Continue seizure calendar, follow-up as scheduled in February 2025, call for any changes.    Thank you for allowing me to participate in her care.  Please do not hesitate to call for any questions or concerns.    Patrcia Dolly, M.D.   CC: Dr. Parke Simmers

## 2023-09-22 ENCOUNTER — Other Ambulatory Visit: Payer: Self-pay | Admitting: Neurology

## 2023-09-30 ENCOUNTER — Encounter: Payer: Self-pay | Admitting: Neurology

## 2023-10-11 ENCOUNTER — Ambulatory Visit
Admission: RE | Admit: 2023-10-11 | Discharge: 2023-10-11 | Disposition: A | Discharge status: Home / Self Care | Payer: MEDICAID | Source: Ambulatory Visit | Attending: Neurology

## 2023-10-11 ENCOUNTER — Other Ambulatory Visit: Payer: Self-pay | Admitting: Neurology

## 2023-10-11 DIAGNOSIS — G40909 Epilepsy, unspecified, not intractable, without status epilepticus: Secondary | ICD-10-CM

## 2023-11-30 ENCOUNTER — Encounter: Payer: Self-pay | Admitting: Neurology

## 2023-11-30 ENCOUNTER — Telehealth: Payer: MEDICAID | Admitting: Neurology

## 2023-11-30 VITALS — Ht 64.5 in | Wt 211.0 lb

## 2023-11-30 DIAGNOSIS — G40909 Epilepsy, unspecified, not intractable, without status epilepticus: Secondary | ICD-10-CM | POA: Diagnosis not present

## 2023-11-30 MED ORDER — LAMOTRIGINE ER 200 MG PO TB24: ORAL_TABLET | ORAL | 3 refills | Status: DC

## 2023-11-30 NOTE — Progress Notes (Signed)
Virtual Visit via Video Note The purpose of this virtual visit is to provide medical care while limiting exposure to the novel coronavirus.    Consent was obtained for video visit:  Yes.   Answered questions that patient had about telehealth interaction:  Yes.    Pt location: Home Physician Location: office Name of referring provider:  Renaye Rakers, MD I connected with Kathryn Peters at patients initiation/request on 11/30/2023 at  2:00 PM EST by video enabled telemedicine application and verified that I am speaking with the correct person using two identifiers. Pt MRN:  [id number] Pt DOB:  [birth year] Video Participants:  Kathryn Peters   History of Present Illness:  The patient had a virtual video visit on 11/30/2023. She was last seen 2 months ago for seizures. She is alone for today's visit. Since her last visit, she denies any seizures typical since 09/11/23 where she had intermittent facial twitching. On 11/20/23 while watching the Superbowl, she states it was not a seizure but it shook her arm. She denies any staring/unresponsive episodes, no feelings of "electrocution," no GTCs since 06/2023. She has had the start of an "electrocution" feeling but it stops. She is taking Lamotrigine 100mg  BID and reports it hurts her stomach, causing diarrhea. She wakes up in the middle of the night to use the bathroom. Diarrhea occurs in the daytime as well. She is off Keppra now. She denies any headaches, dizziness, focal numbness/tingling/weakness, no falls. She reports calcium levels have been okay.  I personally reviewed MRI brain without contrast done 09/2023 which did not show any acute changes. There was some artifact around right mastoid cells, but within this limitation, no significant abnormalities seen. There was scattered T2 hyperintense signal in the white matter, hippocampi roughly symmetric.   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 Her mother is present to provide additional information. Records from Dr. Ellamae Sia at the [facility] were reviewed. She had one brief febrile seizure in infancy. She and her mother report that over the past 2 years, she has been reporting episodes of "absent" seizures and feeling like she is getting "electrocuted" on her head. She had reported these symptoms to Dr. Ellamae Sia in 11/2022, she feels like she has to stare and does not feel like talking, aware of what is happening around her, with "electric like feeling" lasting 10 seconds but can happen many times a day. Her mother recalls one episode while they were in [place] in 05/2023 where she was staring but snapped out of it quickly enough. On 06/19/2023, she alerted her mother that she felt kind of funny/jittery. She told her mother she smelled something burning. She ate then went to sleep within 15 minutes, then her mother heard her call and found her on the bed, then her face grimaced with mouth open followed by a convulsion lasting 5 minutes with head turned to the left, arms flexed. She turned ashen gray and bit her bottom lip. In hindsight, she may have had 2 nocturnal seizures in the past year, she woke up twice with urinary incontinence, most recently in the summer of 2024. She also bit her lip one of those times. She woke up on the floor feeling diffusely weak and sore. She was brought to the ER where bloodwork showed a WBC of 12.8, calcium 8.5. UDS and EtOH negative. I  personally reviewed head CT without contrast which did not show any acute changes. She was discharged home on Levetiracetam 500mg  BID which made her drowsy. She stopped taking it for 3 days then had another seizure on 07/24/23. She was awake and felt something was off, she was running to alert family then fell and had a GTC. Bloodwork showed a WBC of 12.1,  calcium 8.2. She was discharged home with instructions to take calcium supplements and restart Levetiracetam. She then had another seizure the next morning 07/25/23 again as she was running to alert family she was feeling something. She feels diffusely slow, no focal weakness. She continues to report the "electrocuted" feeling and staring where she has to rub something for it to stop, stating it can go all day. The Levetiracetam continues to cause a lot of drowsiness. She has not started calcium yet.   She denies any headaches. She reports a lot of dizziness/lightheadedness, mostly when standing. She has occasional difficulty swallowing and may choke. No diplopia, neck/back pain, focal numbness/tingling/weakness, myoclonic jerks, bowel/bladder dysfunction. Over the past 2 years, she has started having more sensitivity to bright lights and sounds. 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 She reports that she had not been getting good sleep prior to the seizures, but now sleeps 10 hours a day. She has had boils on a monthly basis, around the time of her period. Her mother notes a lot of memory loss, a lot of saying "I don't know" in the past year. Her mother notes the vitiligo on the nasolabial folds are new. She lives with her mother and grandmother. She does not drive. She admits to drinking alcohol socially (rare), none prior to the seizures. Mood is "sleepy."    Epilepsy Risk Factors:  She had a brief febrile seizure in infancy. 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 No history of CNS infections such as meningitis/encephalitis, significant traumatic brain injury, neurosurgical procedures, or family history of seizures.  Diagnostic Data: 1-hour EEG 08/2023 normal MRI brain without contrast done 09/2023 did not show any acute changes. There was some artifact around right mastoid cells, but within this limitation, no significant  abnormalities seen. There was scattered T2 hyperintense signal in the white matter, hippocampi roughly symmetric.   Prior ASMs: Levetiracetam (drowsiness)   Current Outpatient Medications on File Prior to Visit  Medication Sig Dispense Refill   calcium-vitamin D (OSCAL WITH D) 500-5 MG-MCG tablet Take 1 tablet by mouth 2 (two) times daily. 60 tablet 0   carbamide peroxide (DEBROX) 6.5 % OTIC solution Place 5 drops into both ears 2 (two) times daily. 15 mL 0   lamoTRIgine (LAMICTAL) 100 MG tablet Once done with Lamotrigine 25mg  tablet prescription, start Lamotrigine 100mg : take 1 tablet twice a day 60 tablet 6   levETIRAcetam (KEPPRA) 500 MG tablet Take 1 tablet (500 mg total) by mouth 2 (two) times daily. (Patient not taking: Reported on 11/30/2023) 60 tablet 0   metFORMIN (GLUCOPHAGE) 500 MG tablet Take 500 mg by mouth 2 (two) times daily with a meal. (Patient not taking: Reported on 11/30/2023)     No current facility-administered medications on file prior to visit.     Observations/Objective:   Vitals:   11/30/23 1053  Weight: 211 lb (95.7 kg)  Height: 5' 4.5" (1.638 m)   GEN:  The patient appears stated age and is in NAD.  Neurological examination: Patient is awake,  alert. No aphasia or dysarthria. Intact fluency and comprehension.Cranial nerves: Extraocular movements intact. No facial asymmetry. Motor: moves all extremities symmetrically, at least anti-gravity x 4.    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 She reports a history of "absent" seizures and "electrocution" feelings, then recently had 3 witnessed convulsive seizures, last was 06/2023. MRI brain and EEG unremarkable. Seizures can occur in the setting of 22q11.2 deletion syndrome, generally they are due to hypocalcemia, but can also occur in normocalcemic patients. She reports calcium levels have been  okay, continue with calcium supplementation. No further focal motor seizures since 09/2023. She is reporting GI side effects on immediate-release Lamotrigine, we will switch to extended-release Lamotrigine 200mg  daily and monitor response. She does not drive. Follow-up in 3 months, call for any changes.    Follow Up Instructions:   -I discussed the assessment and treatment plan with the patient. The patient was provided an opportunity to ask questions and all were answered. The patient agreed with the plan and demonstrated an understanding of the instructions.   The patient was advised to call back or seek an in-person evaluation if the symptoms worsen or if the condition fails to improve as anticipated.     Van Clines, MD

## 2023-11-30 NOTE — Patient Instructions (Addendum)
Good to see you. Since you are having side effects on the immediate release Lamotrigine, we will switch to the once a day extended-release Lamotrigine 200mg : take 1 tablet daily. Monitor if stomach symptoms improve with this change in formulation.  Continue seizure diary, follow-up in 3 months, call for any changes.   Seizure Precautions: 1. If medication has been prescribed for you to prevent seizures, take it exactly as directed.  Do not stop taking the medicine without talking to your doctor first, even if you have not had a seizure in a long time.   2. Avoid activities in which a seizure would cause danger to yourself or to others.  Don't operate dangerous machinery, swim alone, or climb in high or dangerous places, such as on ladders, roofs, or girders.  Do not drive unless your doctor says you may.  3. If you have any warning that you may have a seizure, lay down in a safe place where you can't hurt yourself.    4.  No driving for 6 months from last seizure, as per [facility].   Please refer to the following link on the Epile[employer] Foundation of [employer]'s website for more information: http://www.epilepsyfoundation.org/answerplace/Social/driving/drivingu.cfm   5.  Maintain good sleep hygiene. Avoid alcohol.  6.  Notify your neurology if you are planning pregnancy or if you become pregnant.  7.  Contact your doctor if you have any problems that may be related to the medicine you are taking.  8.  Call 911 and bring the patient back to the ED if:        A.  The seizure lasts longer than 5 minutes.       B.  The patient doesn't awaken shortly after the seizure  C.  The patient has new problems such as difficulty seeing, speaking or moving  D.  The patient was injured during the seizure  E.  The patient has a temperature over 102 F (39C)  F.  The patient vomited and now is having trouble breathing

## 2023-12-01 ENCOUNTER — Telehealth: Payer: Self-pay | Admitting: Neurology

## 2023-12-01 ENCOUNTER — Encounter: Payer: Self-pay | Admitting: Neurology

## 2023-12-01 NOTE — Telephone Encounter (Signed)
She already had the brain MRI in December, no need to repeat. It was normal, thanks

## 2023-12-01 NOTE — Telephone Encounter (Signed)
Pt called an informed She already had the brain MRI in December, no need to repeat. It was normal

## 2023-12-01 NOTE — Telephone Encounter (Signed)
Pt. Would like to order MRI as last appointment was cncld prior to new appt on 02/27/24.

## 2023-12-01 NOTE — Telephone Encounter (Signed)
Pt called again informed Dr Karel Jarvis said that MRI was not needed

## 2023-12-01 NOTE — Telephone Encounter (Signed)
Pt left message with AN. She would like to schedule an MRI

## 2024-01-19 ENCOUNTER — Telehealth: Payer: Self-pay

## 2024-01-19 DIAGNOSIS — G40909 Epilepsy, unspecified, not intractable, without status epilepticus: Secondary | ICD-10-CM

## 2024-01-19 NOTE — Telephone Encounter (Signed)
 Pt is asking for a new order for MRI of the brain to be sent to [facility]

## 2024-01-19 NOTE — Telephone Encounter (Signed)
 Pt c/o: seizure Missed medications?  No. Sleep deprived?  Yes.   They tropical birds chirping at midnight keeping her up past 3 days. They moved her room last night so she could get more sleep and she slept better.  Alcohol intake?  No. Increased stress? No. Any change in medication color or shape? No. Back to their usual baseline self?  Yes.  . If no, advise go to ER Current medications prescribed by Dr. Karel Jarvis:    LamoTRIgine 200 MG TB24 24 hour tablet Take 1 tablet daily,    She has not missed any medication,  She has not had a seizure just the aura  They have been advised that lack of sleep can cause they aura and cause seizures they are going to keep her in the room away from the birds and work on getting more sleep

## 2024-01-20 NOTE — Telephone Encounter (Signed)
 Pt mom is saying that they stopped the MRI because she had metal in her ear, she no longer has the metal in her ear and mom wants her to have MRI. She said the tech said that she could not have the 1st MRI because the machine would pull the earring out of her ear, I told her that we have results and she still did not believe me she said she hears me but its not true, she is out of work for spring break and wants her to have the test done,

## 2024-01-20 NOTE — Telephone Encounter (Signed)
Pt mother called no answer left a voice mail to call the office back  

## 2024-01-20 NOTE — Telephone Encounter (Signed)
 Patient has already been told about this, she already had an MRI in December, she does not need another one. Thanks

## 2024-01-23 NOTE — Telephone Encounter (Signed)
 That is fine, pls order repeat MRI brain without contrast, dx: epilepsy. Thanks

## 2024-01-24 NOTE — Telephone Encounter (Signed)
 MRI has been ordered at [facility]

## 2024-01-25 ENCOUNTER — Encounter: Payer: Self-pay | Admitting: Neurology

## 2024-01-27 ENCOUNTER — Ambulatory Visit
Admission: RE | Admit: 2024-01-27 | Discharge: 2024-01-27 | Disposition: A | Discharge status: Home / Self Care | Payer: MEDICAID | Source: Ambulatory Visit | Attending: Neurology | Admitting: Neurology

## 2024-01-27 DIAGNOSIS — G40909 Epilepsy, unspecified, not intractable, without status epilepticus: Secondary | ICD-10-CM

## 2024-02-16 ENCOUNTER — Emergency Department (HOSPITAL_COMMUNITY)
Admission: EM | Admit: 2024-02-16 | Discharge: 2024-02-17 | Disposition: A | Discharge status: Home / Self Care | Payer: MEDICAID | Attending: Emergency Medicine | Admitting: Emergency Medicine

## 2024-02-16 ENCOUNTER — Other Ambulatory Visit: Payer: Self-pay

## 2024-02-16 ENCOUNTER — Encounter (HOSPITAL_COMMUNITY): Payer: Self-pay

## 2024-02-16 DIAGNOSIS — R569 Unspecified convulsions: Secondary | ICD-10-CM | POA: Insufficient documentation

## 2024-02-16 DIAGNOSIS — L02412 Cutaneous abscess of left axilla: Secondary | ICD-10-CM | POA: Insufficient documentation

## 2024-02-16 DIAGNOSIS — L0291 Cutaneous abscess, unspecified: Secondary | ICD-10-CM

## 2024-02-16 LAB — COMPREHENSIVE METABOLIC PANEL WITH GFR
ALT: 13 U/L (ref 0–44)
AST: 28 U/L (ref 15–41)
Albumin: 3.8 g/dL (ref 3.5–5.0)
Alkaline Phosphatase: 76 U/L (ref 38–126)
Anion gap: 10 (ref 5–15)
BUN: 9 mg/dL (ref 6–20)
CO2: 20 mmol/L — ABNORMAL LOW (ref 22–32)
Calcium: 8.6 mg/dL — ABNORMAL LOW (ref 8.9–10.3)
Chloride: 106 mmol/L (ref 98–111)
Creatinine, Ser: 0.82 mg/dL (ref 0.44–1.00)
GFR, Estimated: >60 mL/min (ref >60)
Glucose, Bld: 112 mg/dL — ABNORMAL HIGH (ref 70–99)
Potassium: 4.8 mmol/L (ref 3.5–5.1)
Sodium: 136 mmol/L (ref 135–145)
Total Bilirubin: 0.8 mg/dL (ref 0.0–1.2)
Total Protein: 7.4 g/dL (ref 6.5–8.1)

## 2024-02-16 LAB — CBC WITH DIFFERENTIAL/PLATELET
Abs Immature Granulocytes: 0.05 10*3/uL (ref 0.00–0.07)
Basophils Absolute: 0.1 10*3/uL (ref 0.0–0.1)
Basophils Relative: 0 %
Eosinophils Absolute: 0 10*3/uL (ref 0.0–0.5)
Eosinophils Relative: 0 %
HCT: 38 % (ref 36.0–46.0)
Hemoglobin: 11.8 g/dL — ABNORMAL LOW (ref 12.0–15.0)
Immature Granulocytes: 0 %
Lymphocytes Relative: 11 %
Lymphs Abs: 1.5 10*3/uL (ref 0.7–4.0)
MCH: 27.8 pg (ref 26.0–34.0)
MCHC: 31.1 g/dL (ref 30.0–36.0)
MCV: 89.4 fL (ref 80.0–100.0)
Monocytes Absolute: 0.5 10*3/uL (ref 0.1–1.0)
Monocytes Relative: 4 %
Neutro Abs: 11.2 10*3/uL — ABNORMAL HIGH (ref 1.7–7.7)
Neutrophils Relative %: 85 %
Platelets: 231 10*3/uL (ref 150–400)
RBC: 4.25 MIL/uL (ref 3.87–5.11)
RDW: 13.6 % (ref 11.5–15.5)
WBC: 13.4 10*3/uL — ABNORMAL HIGH (ref 4.0–10.5)
nRBC: 0 % (ref 0.0–0.2)

## 2024-02-16 LAB — RESP PANEL BY RT-PCR (RSV, FLU A&B, COVID)  RVPGX2
Influenza A by PCR: NEGATIVE
Influenza B by PCR: NEGATIVE
Resp Syncytial Virus by PCR: NEGATIVE
SARS Coronavirus 2 by RT PCR: NEGATIVE

## 2024-02-16 LAB — HCG, SERUM, QUALITATIVE: Preg, Serum: NEGATIVE

## 2024-02-16 LAB — GROUP A STREP BY PCR: Group A Strep by PCR: NOT DETECTED

## 2024-02-16 MED ORDER — LORAZEPAM 2 MG/ML IJ SOLN
1.0000 mg | Freq: Once | INTRAMUSCULAR | Status: AC
  Administered 2024-02-16: 1 mg via INTRAVENOUS
  Filled 2024-02-16: qty 1

## 2024-02-16 MED ORDER — ACETAMINOPHEN 500 MG PO TABS
1000.0000 mg | ORAL_TABLET | Freq: Once | ORAL | Status: AC
  Administered 2024-02-16: 1000 mg via ORAL
  Filled 2024-02-16: qty 2

## 2024-02-16 NOTE — ED Provider Triage Note (Signed)
 Emergency Medicine Provider Triage Evaluation Note  Kathryn Peters , a 28 y.o. female  was evaluated in triage.  Pt complains of headache and felt tremulous.  Patient has a history of collapsing more recently diagnosed and has had an MRI but has not had results yet.  She has felt like she is getting tingling in the left arm and possibly having focal seizures.  She is currently prescribed lamotrigine  and has taken her daily dose.  Patient also reports sore throat and some recent cough  Review of Systems  Positive: Headache Negative: Fever  Physical Exam  BP 133/87 (BP Location: Right Arm)   Pulse (!) 111   Temp 99.3 F (37.4 C) (Oral)   Resp 18   Ht 5' 4.5" (1.638 m)   Wt 95.3 kg   SpO2 98%   BMI 35.49 kg/m  Gen:   Awake, patient is alert.  She is slightly tearful and uncomfortable in appearance. Resp:  Normal effort clear to auscultation MSK:   Moves extremities without difficulty no focal motor deficits. Other:  Patient is intermittently trembling both hands and padding them on a pillow in front of her.  This does extinguish with conversation and interaction.  No sign at this time of any active seizure.  Medical Decision Making  Medically screening exam initiated at 10:52 PM.  Appropriate orders placed.  Kathryn Peters was informed that the remainder of the evaluation will be completed by another provider, this initial triage assessment does not replace that evaluation, and the importance of remaining in the ED until their evaluation is complete.  Will give 1 dose of Ativan and obtain basic lab work.   Kathryn Heckle, MD 02/16/24 2302

## 2024-02-16 NOTE — ED Triage Notes (Signed)
 Pt BIB [facility] from home with multiple complaints. Pt has a hx of epilepsy and has not slept in a couple days, is anxious, having focal seizures, a migraine, left arm tingling and c/o an abscess under the left armpit.

## 2024-02-17 ENCOUNTER — Other Ambulatory Visit: Payer: Self-pay

## 2024-02-17 ENCOUNTER — Telehealth: Payer: Self-pay | Admitting: Neurology

## 2024-02-17 DIAGNOSIS — G40909 Epilepsy, unspecified, not intractable, without status epilepticus: Secondary | ICD-10-CM

## 2024-02-17 MED ORDER — AMOXICILLIN-POT CLAVULANATE 875-125 MG PO TABS: 1.0000 | ORAL_TABLET | Freq: Two times a day (BID) | ORAL | 0 refills | Status: DC

## 2024-02-17 NOTE — Telephone Encounter (Signed)
 Pls let mother know the brain MRI looked fine, no tumor, stroke, or bleed. When patients have an infection, it can lower their threshold to have seizures. How is she doing? I would like to do a Lamictal  level to check how much medication is in her system, recommend having bloodwork done right before she takes her medication (either early morning if she takes med in AM or at the end of the day if she takes Lamotrigine  ER at bedtime. Thanks

## 2024-02-17 NOTE — Telephone Encounter (Signed)
 Pt's mother called in again to let us  know the pt was in the hospital. She had multiple auras back to back. Is upset the MRI still has not been read. Would like to speak with someone.

## 2024-02-17 NOTE — ED Provider Notes (Signed)
 [place] EMERGENCY DEPARTMENT AT [facility] Provider Note   CSN: [id number] Arrival date & time: 02/16/24  2137     History  Chief Complaint  Patient presents with   Abscess   Migraine    Kathryn Peters is a 28 y.o. female.  28 yo F with a chief complaints of increased seizure activity.  The patient has been struggling with cough and congestion going on for a few days.  I developed a right-sided headache and was having repetitive events where she shakes.  Months that she had an uncountable number of events that are typical of seizures.  She has been seeing a neurologist for this.  Was recently changed from Keppra  to Lamictal .  She feels like she has been compliant with her medications.  She also has a abscess to her left axilla.  Gets these from time to time.     Abscess Migraine       Home Medications Prior to Admission medications   Medication Sig Start Date End Date Taking? Authorizing Provider  amoxicillin -clavulanate (AUGMENTIN ) 875-125 MG tablet Take 1 tablet by mouth every 12 (twelve) hours. 02/17/24  Yes Albertus Hughs, DO  calcium -vitamin D (OSCAL WITH D) 500-5 MG-MCG tablet Take 1 tablet by mouth 2 (two) times daily. 07/24/23   Alissa April, MD  carbamide peroxide (DEBROX) 6.5 % OTIC solution Place 5 drops into both ears 2 (two) times daily. 03/21/23   Buena Carmine, NP  LamoTRIgine  200 MG TB24 24 hour tablet Take 1 tablet daily 11/30/23   Jhonny Moss, MD  levETIRAcetam  (KEPPRA ) 500 MG tablet Take 1 tablet (500 mg total) by mouth 2 (two) times daily. Patient not taking: Reported on 11/30/2023 09/11/23   Auston Blush, MD  metFORMIN (GLUCOPHAGE) 500 MG tablet Take 500 mg by mouth 2 (two) times daily with a meal. Patient not taking: Reported on 11/30/2023    [provider]      Allergies    Prunus persica, Food, and Pineapple    Review of Systems   Review of Systems  Physical Exam Updated Vital Signs BP 126/72   Pulse 77   Temp  97.9 F (36.6 C) (Oral)   Resp 18   Ht 5' 4.5" (1.638 m)   Wt 95.3 kg   SpO2 100%   BMI 35.49 kg/m  Physical Exam Vitals and nursing note reviewed.  Constitutional:      General: She is not in acute distress.    Appearance: She is well-developed. She is not diaphoretic.  HENT:     Head: Normocephalic and atraumatic.     Comments: Swollen turbinates, posterior nasal drip, right maxillary sinus exquisitely tender to percussion  Eyes:     Pupils: Pupils are equal, round, and reactive to light.  Cardiovascular:     Rate and Rhythm: Normal rate and regular rhythm.     Heart sounds: No murmur heard.    No friction rub. No gallop.  Pulmonary:     Effort: Pulmonary effort is normal.     Breath sounds: No wheezing or rales.  Abdominal:     General: There is no distension.     Palpations: Abdomen is soft.     Tenderness: There is no abdominal tenderness.  Musculoskeletal:        General: No tenderness.     Cervical back: Normal range of motion and neck supple.     Comments: Patient has a small lesion to the left axilla, no obvious change in  the skin color.  No induration.  Mildly tender.  Skin:    General: Skin is warm and dry.  Neurological:     Mental Status: She is alert and oriented to person, place, and time.     Cranial Nerves: Cranial nerves 2-12 are intact.     Sensory: Sensation is intact.     Motor: Motor function is intact.     Coordination: Coordination is intact.     Comments: Benign neuroexam  Psychiatric:        Behavior: Behavior normal.     ED Results / Procedures / Treatments   Labs (all labs ordered are listed, but only abnormal results are displayed) Labs Reviewed  COMPREHENSIVE METABOLIC PANEL WITH GFR - Abnormal; Notable for the following components:      Result Value   CO2 20 (*)    Glucose, Bld 112 (*)    Calcium  8.6 (*)    All other components within normal limits  CBC WITH DIFFERENTIAL/PLATELET - Abnormal; Notable for the following components:    WBC 13.4 (*)    Hemoglobin 11.8 (*)    Neutro Abs 11.2 (*)    All other components within normal limits  GROUP A STREP BY PCR  RESP PANEL BY RT-PCR (RSV, FLU A&B, COVID)  RVPGX2  HCG, SERUM, QUALITATIVE  URINALYSIS, ROUTINE W REFLEX MICROSCOPIC    EKG None  Radiology No results found.  Procedures Procedures    Medications Ordered in ED Medications  LORazepam  (ATIVAN ) injection 1 mg (1 mg Intravenous Given 02/16/24 2310)  acetaminophen  (TYLENOL ) tablet 1,000 mg (1,000 mg Oral Given 02/16/24 2310)    ED Course/ Medical Decision Making/ A&P                                 Medical Decision Making Risk Prescription drug management.   28 yo F with a significant past medical history of DiGeorge syndrome with new onset shaking events that are concerning for possible focal seizure activity followed by neurology comes in with a chief complaints of increased frequency of the same.  Had had multiple events yesterday in the setting of what sounds like an upper respiratory illness.  Clinically she has signs of sinusitis.  She is doing much better after a dose of Ativan .  Has been unfortunately waiting to be seen for about 10 hours but has not had any other events since she has been here.  Headache also significantly better.  Benign neurologic exam for me.  Will have her follow-up with her neurologist in the office.  Course of antibiotics.  She had a secondary complaints of an abscess to her left axilla.  I do not appreciate any obvious change to the skin.  Makes me worried that I will be successful with I&D.  Warm compresses.  PCP follow-up.  7:43 AM:  I have discussed the diagnosis/risks/treatment options with the patient and family.  Evaluation and diagnostic testing in the emergency department does not suggest an emergent condition requiring admission or immediate intervention beyond what has been performed at this time.  They will follow up with PCP, neuro. We also discussed returning to  the ED immediately if new or worsening sx occur. We discussed the sx which are most concerning (e.g., sudden worsening pain, fever, inability to tolerate by mouth, rapid spreading redness) that necessitate immediate return. Medications administered to the patient during their visit and any new prescriptions provided to the patient  are listed below.  Medications given during this visit Medications  LORazepam  (ATIVAN ) injection 1 mg (1 mg Intravenous Given 02/16/24 2310)  acetaminophen  (TYLENOL ) tablet 1,000 mg (1,000 mg Oral Given 02/16/24 2310)     The patient appears reasonably screen and/or stabilized for discharge and I doubt any other medical condition or other [facility] requiring further screening, evaluation, or treatment in the ED at this time prior to discharge.          Final Clinical Impression(s) / ED Diagnoses Final diagnoses:  Seizure-like activity (HCC)  Abscess    Rx / DC Orders ED Discharge Orders          Ordered    amoxicillin -clavulanate (AUGMENTIN ) 875-125 MG tablet  Every 12 hours        02/17/24 0737    Ambulatory referral to Neurology        02/17/24 0737              Albertus Hughs, DO 02/17/24 [phone]

## 2024-02-17 NOTE — ED Notes (Signed)
 Pt understood d/c instructions and when to return to the ED. IV was d/c and medications reviewed. Pt left w/ mother who is taking pt home

## 2024-02-17 NOTE — Telephone Encounter (Signed)
 Called patients mom and spoke to her about MRI and Dr. Peyton Brash recommendations . I have printed the Lamictal  lab and placed in the folder up front for patient to get on Monday

## 2024-02-17 NOTE — Discharge Instructions (Signed)
Warm compresses at least 4 times a day.  Please return for rapid spreading redness or if you develop a fever.  Take the antibiotics as prescribed.

## 2024-02-17 NOTE — Telephone Encounter (Signed)
 Pt.s mom called afterhours states:Pt having 4 or 5 waves of shaking arms and legs nonstop.Pt., has seizure pt. Is on meds currently sitting keeps moving hand over pillow moving back and forth." Nurse provided that pt. Should go to ED now  at 8:02 PM- Left copy of Afterhours call summary inbox

## 2024-02-20 ENCOUNTER — Other Ambulatory Visit: Payer: MEDICAID

## 2024-02-23 LAB — LAMOTRIGINE LEVEL: Lamotrigine Lvl: 4.4 ug/mL (ref 2.5–15.0)

## 2024-02-27 ENCOUNTER — Ambulatory Visit: Payer: MEDICAID | Admitting: Neurology

## 2024-02-27 ENCOUNTER — Encounter: Payer: Self-pay | Admitting: Neurology

## 2024-02-27 VITALS — BP 119/81 | HR 87 | Ht 64.5 in | Wt 211.8 lb

## 2024-02-27 DIAGNOSIS — G40909 Epilepsy, unspecified, not intractable, without status epilepticus: Secondary | ICD-10-CM

## 2024-02-27 DIAGNOSIS — F419 Anxiety disorder, unspecified: Secondary | ICD-10-CM | POA: Diagnosis not present

## 2024-02-27 MED ORDER — LAMOTRIGINE ER 300 MG PO TB24: ORAL_TABLET | ORAL | 3 refills | Status: DC

## 2024-02-27 NOTE — Progress Notes (Signed)
 NEUROLOGY FOLLOW UP OFFICE NOTE  Kathryn Peters [id number] Dec 13, 1995  HISTORY OF PRESENT ILLNESS: I had the pleasure of seeing Kathryn Peters in follow-up in the neurology clinic on 02/27/2024.  The patient was last seen 3 months ago for seizures.  She is again accompanied by her mother who helps supplement the history today.  Records and images were personally reviewed where available. I personally reviewed repeat brain MRI without contrast done 01/27/24 which did not show any acute changes, hippocampi symmetric with no abnormal signal seen. There were a few small FLAIR hyperintensities mainly in the bifrontal white matter. Since her last visit, they contacted our office about auras on 4/10 due to sleep deprivation. She was in the ER on 02/16/24 for increased seizure activity. She had been having cough and congestion for a few days then developed a right-sided headache and repetitive events where she shakes. Bloodwork showed a WBC of 13.4. She was given a dose of Ativan  with note of improvement, no further events for the 10-hour wait in the ER. She was reporting GI side effects on immediate-release Lamotrigine , switched to ER formulation 200mg  daily on last visit. Lamotrigine  level on 02/20/24 was 4.4.  She reports she had an aura she could not get rid of with the electrocution feeling, unable to control her arms and legs. She denies any loss of awareness, however her mother reports she was staring, spitting/drooling, with eyes closed and head going down. Her right hand was moving over the pillow, she was tapping on the couch. She reports her whole left side went numb. She got so anxious that she did not want to go with EMS, however after the 4th episode, her mother called them. She denies any episodes since 5/8. She feels the Ativan  helped her a lot, she woke up after the dose saying "I have not felt like this in a long time, I feel normal." She reports new symptoms where her body tingles. Her mother  reports she gets very anxious and agitated easily, her mother cannot have normal conversations even talking about cleaning up, she says "I know what to do." She denies any frequent headaches but did have one when she was in the ER. She is forgetful. She is not sleeping well. Mood is "bored." She taps a lot with a restless feeling. They report she stopped the Augmentin  started in the ER for sinusitis, she also had a flare up of boils in her underarm.    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 Her mother is present to provide additional information. Records from Dr. Coit Dasen at the [facility] were reviewed. She had one brief febrile seizure in infancy. She and her mother report that over the past 2 years, she has been reporting episodes of "absent" seizures and feeling like she is getting "electrocuted" on her head. She had reported these symptoms to Dr. Coit Dasen in 11/2022, she feels like she has to stare and does not feel like talking, aware of what is happening around her, with "electric like feeling" lasting 10 seconds but can happen many times a day. Her mother recalls one episode while they were in [place] in 05/2023 where she was staring but snapped out of it quickly enough. On 06/19/2023, she alerted her mother that she felt kind of funny/jittery. She told her mother she smelled something burning. She ate  then went to sleep within 15 minutes, then her mother heard her call and found her on the bed, then her face grimaced with mouth open followed by a convulsion lasting 5 minutes with head turned to the left, arms flexed. She turned ashen gray and bit her bottom lip. In hindsight, she may have had 2 nocturnal seizures in the past year, she woke up twice with urinary incontinence, most recently in the summer of 2024. She also bit  her lip one of those times. She woke up on the floor feeling diffusely weak and sore. She was brought to the ER where bloodwork showed a WBC of 12.8, calcium  8.5. UDS and EtOH negative. I personally reviewed head CT without contrast which did not show any acute changes. She was discharged home on Levetiracetam  500mg  BID which made her drowsy. She stopped taking it for 3 days then had another seizure on 07/24/23. She was awake and felt something was off, she was running to alert family then fell and had a GTC. Bloodwork showed a WBC of 12.1, calcium  8.2. She was discharged home with instructions to take calcium  supplements and restart Levetiracetam . She then had another seizure the next morning 07/25/23 again as she was running to alert family she was feeling something. She feels diffusely slow, no focal weakness. She continues to report the "electrocuted" feeling and staring where she has to rub something for it to stop, stating it can go all day. The Levetiracetam  continues to cause a lot of drowsiness. She has not started calcium  yet.   She denies any headaches. She reports a lot of dizziness/lightheadedness, mostly when standing. She has occasional difficulty swallowing and may choke. No diplopia, neck/back pain, focal numbness/tingling/weakness, myoclonic jerks, bowel/bladder dysfunction. Over the past 2 years, she has started having more sensitivity to bright lights and sounds. 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 She reports that she had not been getting good sleep prior to the seizures, but now sleeps 10 hours a day. She has had boils on a monthly basis, around the time of her period. Her mother notes a lot of memory loss, a lot of saying "I don't know" in the past year. Her mother notes the vitiligo on the nasolabial folds are new. She lives with her mother and grandmother. She does not drive. She admits to drinking alcohol socially (rare), none prior  to the seizures. Mood is "sleepy."    Epilepsy Risk Factors:  She had a brief febrile seizure in infancy. 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 No history of CNS infections such as meningitis/encephalitis, significant traumatic brain injury, neurosurgical procedures, or family history of seizures.  Diagnostic Data: 1-hour EEG 08/2023 normal MRI brain without contrast done 09/2023 did not show any acute changes. There was some artifact around right mastoid cells, but within this limitation, no significant abnormalities seen. There was scattered T2 hyperintense signal in the white matter, hippocampi roughly symmetric.  Prior ASMs: Levetiracetam  (drowsiness), immediate-release Lamotrigine  (GI side effects)   PAST MEDICAL HISTORY: Past Medical History:  Diagnosis Date   Di George's syndrome [facility])     MEDICATIONS: Current Outpatient Medications on File Prior to Visit  Medication Sig Dispense Refill   amoxicillin -clavulanate (AUGMENTIN ) 875-125 MG tablet Take 1 tablet by mouth every 12 (twelve) hours. 14 tablet 0   calcium -vitamin D (OSCAL WITH D) 500-5 MG-MCG tablet Take 1 tablet by mouth 2 (two) times daily.  60 tablet 0   carbamide peroxide (DEBROX) 6.5 % OTIC solution Place 5 drops into both ears 2 (two) times daily. 15 mL 0   LamoTRIgine  200 MG TB24 24 hour tablet Take 1 tablet daily 90 tablet 3   levETIRAcetam  (KEPPRA ) 500 MG tablet Take 1 tablet (500 mg total) by mouth 2 (two) times daily. (Patient not taking: Reported on 11/30/2023) 60 tablet 0   metFORMIN (GLUCOPHAGE) 500 MG tablet Take 500 mg by mouth 2 (two) times daily with a meal. (Patient not taking: Reported on 11/30/2023)     No current facility-administered medications on file prior to visit.    ALLERGIES: Allergies  Allergen Reactions   Prunus Persica Swelling   Food Itching and Other (See Comments)    Pt states that she is allergic to strawberries, peaches   Pineapple Swelling and Other  (See Comments)    Reaction:  Tongue swelling   Strawberry Extract     FAMILY HISTORY: Family History  Problem Relation Age of Onset   Hypertension Father     SOCIAL HISTORY: Social History   Socioeconomic History   Marital status: Single    Spouse name: Not on file   Number of children: Not on file   Years of education: Not on file   Highest education level: Not on file  Occupational History   Not on file  Tobacco Use   Smoking status: Never   Smokeless tobacco: Never  Vaping Use   Vaping status: Never Used  Substance and Sexual Activity   Alcohol use: No   Drug use: No   Sexual activity: Not on file  Other Topics Concern   Not on file  Social History Narrative   Are you right handed or left handed? Right    Are you currently employed ? 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 No    Who lives with you? Family    What type of home do you live in: 1 story or 2 story?  2 story        Social Drivers of Corporate investment banker Strain: Not on file  Food Insecurity: Not on file  Transportation Needs: Not on file  Physical Activity: Not on file  Stress: Not on file  Social Connections: Unknown (02/23/2022)   [facility]   Social Network    Social Network: Not on file  Intimate Partner Violence: Unknown (01/15/2022)   Received from [facility], [facility]   HITS    Physically Hurt: Not on file    Insult or Talk Down To: Not on file    Threaten Physical Harm: Not on file    Scream or Curse: Not on file     PHYSICAL EXAM: Vitals:   02/27/24 0835  BP: 119/81  Pulse: 87  SpO2: 98%   General: No acute distress Head:  Normocephalic/atraumatic Skin/Extremities: No rash, no edema Neurological Exam: alert and awake. No aphasia or dysarthria. Fund of knowledge is appropriate.  Attention and concentration are normal.   Cranial nerves: Pupils equal, round. Extraocular movements intact  with no nystagmus. Visual fields full.  No facial asymmetry.  Motor: Bulk and tone normal, muscle strength 5/5 throughout with no pronator drift.   Finger to nose testing intact.  Gait narrow-based and steady, able to tandem walk adequately.  Romberg negative.   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 She reports a history of "absent" seizures and "electrocution" feelings, then had 3 witnessed convulsive seizures. She was in the ER for clusters of the "electrocution" symptoms, her mother reported staring off. There is also a significant amount of anxiety/agitation. We discussed increasing Lamotrigine  ER to 300mg  at bedtime. She will be referred for EMU monitoring for characterization. They agree to [facility] referral for Psychiatry and psychotherapy. She does not drive. Follow-up in 3-4 months, call for any changes.   Thank you for allowing me to participate in her care.  Please do not hesitate to call for any questions or concerns.    Rayfield Cairo, M.D.   CC: Dr. Nida Barrow

## 2024-02-27 NOTE — Patient Instructions (Addendum)
 Good to see you.  Increase Lamotrigine  ER 300mg  every night. You can take it at 3pm tomorrow (May 20) as you transition, then every evening after dinner starting May 21  2. Schedule inpatient video EEG monitoring  3. Referral will be sent to [facility] for psychiatry and therapy  4. Please give Dr. Nelle Ban office a call [phone]) to schedule 1-year visit  5. Follow-up in 3-4 months, call for any changes   Seizure Precautions: 1. If medication has been prescribed for you to prevent seizures, take it exactly as directed.  Do not stop taking the medicine without talking to your doctor first, even if you have not had a seizure in a long time.   2. Avoid activities in which a seizure would cause danger to yourself or to others.  Don't operate dangerous machinery, swim alone, or climb in high or dangerous places, such as on ladders, roofs, or girders.  Do not drive unless your doctor says you may.  3. If you have any warning that you may have a seizure, lay down in a safe place where you can't hurt yourself.    4.  No driving for 6 months from last seizure, as per [place]  state law.   Please refer to the following link on the Epilepsy Foundation of America's website for more information: http://www.epilepsyfoundation.org/answerplace/Social/driving/drivingu.cfm   5.  Maintain good sleep hygiene. Avoid alcohol.  6.  Notify your neurology if you are planning pregnancy or if you become pregnant.  7.  Contact your doctor if you have any problems that may be related to the medicine you are taking.  8.  Call 911 and bring the patient back to the ED if:        A.  The seizure lasts longer than 5 minutes.       B.  The patient doesn't awaken shortly after the seizure  C.  The patient has new problems such as difficulty seeing, speaking or moving  D.  The patient was injured during the seizure  E.  The patient has a temperature over 102 F (39C)  F.  The patient vomited and now  is having trouble breathing

## 2024-03-11 ENCOUNTER — Encounter: Payer: Self-pay | Admitting: Neurology

## 2024-03-23 ENCOUNTER — Telehealth: Payer: Self-pay | Admitting: Neurology

## 2024-03-23 NOTE — Telephone Encounter (Signed)
 Pt mother called and given the information  Psychiatry referral sent to  [address]  [phone] Staff message and email sent to [facility] department to get pt scheduled

## 2024-03-23 NOTE — Telephone Encounter (Signed)
 Patient mother called and states that they have not heard anything about getting her sch for a Psychiarty and EEG done in the hospital

## 2024-04-23 ENCOUNTER — Other Ambulatory Visit: Payer: Self-pay

## 2024-04-23 ENCOUNTER — Inpatient Hospital Stay (HOSPITAL_COMMUNITY)
Admission: RE | Admit: 2024-04-23 | Discharge: 2024-04-27 | DRG: 100 | Disposition: A | Discharge status: Home / Self Care | Payer: MEDICAID | Source: Ambulatory Visit | Attending: Neurology | Admitting: Neurology

## 2024-04-23 ENCOUNTER — Encounter (HOSPITAL_COMMUNITY): Payer: Self-pay | Admitting: Neurology

## 2024-04-23 ENCOUNTER — Encounter (HOSPITAL_COMMUNITY): Payer: MEDICAID

## 2024-04-23 DIAGNOSIS — G40909 Epilepsy, unspecified, not intractable, without status epilepticus: Secondary | ICD-10-CM | POA: Diagnosis present

## 2024-04-23 DIAGNOSIS — D821 Di George's syndrome: Secondary | ICD-10-CM | POA: Diagnosis present

## 2024-04-23 DIAGNOSIS — L0292 Furuncle, unspecified: Secondary | ICD-10-CM | POA: Diagnosis present

## 2024-04-23 DIAGNOSIS — Z79899 Other long term (current) drug therapy: Secondary | ICD-10-CM | POA: Diagnosis not present

## 2024-04-23 DIAGNOSIS — F70 Mild intellectual disabilities: Secondary | ICD-10-CM | POA: Diagnosis present

## 2024-04-23 DIAGNOSIS — L80 Vitiligo: Secondary | ICD-10-CM | POA: Diagnosis present

## 2024-04-23 DIAGNOSIS — Q9381 Velo-cardio-facial syndrome: Secondary | ICD-10-CM | POA: Diagnosis not present

## 2024-04-23 DIAGNOSIS — R569 Unspecified convulsions: Secondary | ICD-10-CM | POA: Diagnosis present

## 2024-04-23 DIAGNOSIS — Z91018 Allergy to other foods: Secondary | ICD-10-CM | POA: Diagnosis not present

## 2024-04-23 DIAGNOSIS — Z8249 Family history of ischemic heart disease and other diseases of the circulatory system: Secondary | ICD-10-CM

## 2024-04-23 LAB — RAPID URINE DRUG SCREEN, HOSP PERFORMED
Amphetamines: NOT DETECTED
Barbiturates: NOT DETECTED
Benzodiazepines: NOT DETECTED
Cocaine: NOT DETECTED
Opiates: NOT DETECTED
Tetrahydrocannabinol: NOT DETECTED

## 2024-04-23 LAB — CBC WITH DIFFERENTIAL/PLATELET
Abs Immature Granulocytes: 0.06 K/uL (ref 0.00–0.07)
Basophils Absolute: 0.1 K/uL (ref 0.0–0.1)
Basophils Relative: 0 %
Eosinophils Absolute: 0.1 K/uL (ref 0.0–0.5)
Eosinophils Relative: 1 %
HCT: 38.2 % (ref 36.0–46.0)
Hemoglobin: 12.2 g/dL (ref 12.0–15.0)
Immature Granulocytes: 0 %
Lymphocytes Relative: 16 %
Lymphs Abs: 2.4 K/uL (ref 0.7–4.0)
MCH: 27.1 pg (ref 26.0–34.0)
MCHC: 31.9 g/dL (ref 30.0–36.0)
MCV: 84.7 fL (ref 80.0–100.0)
Monocytes Absolute: 0.9 K/uL (ref 0.1–1.0)
Monocytes Relative: 6 %
Neutro Abs: 11.4 K/uL — ABNORMAL HIGH (ref 1.7–7.7)
Neutrophils Relative %: 77 %
Platelets: 161 K/uL (ref 150–400)
RBC: 4.51 MIL/uL (ref 3.87–5.11)
RDW: 14.2 % (ref 11.5–15.5)
WBC: 14.9 K/uL — ABNORMAL HIGH (ref 4.0–10.5)
nRBC: 0 % (ref 0.0–0.2)

## 2024-04-23 LAB — RESPIRATORY PANEL BY PCR

## 2024-04-23 LAB — URINALYSIS, ROUTINE W REFLEX MICROSCOPIC
Bilirubin Urine: NEGATIVE
Glucose, UA: NEGATIVE mg/dL
Hgb urine dipstick: NEGATIVE
Ketones, ur: NEGATIVE mg/dL
Leukocytes,Ua: NEGATIVE
Nitrite: NEGATIVE
Protein, ur: NEGATIVE mg/dL
Specific Gravity, Urine: 1.013 (ref 1.005–1.030)
pH: 6 (ref 5.0–8.0)

## 2024-04-23 LAB — COMPREHENSIVE METABOLIC PANEL WITH GFR
ALT: 15 U/L (ref 0–44)
AST: 21 U/L (ref 15–41)
Albumin: 4 g/dL (ref 3.5–5.0)
Alkaline Phosphatase: 87 U/L (ref 38–126)
Anion gap: 12 (ref 5–15)
BUN: 9 mg/dL (ref 6–20)
CO2: 20 mmol/L — ABNORMAL LOW (ref 22–32)
Calcium: 8.6 mg/dL — ABNORMAL LOW (ref 8.9–10.3)
Chloride: 103 mmol/L (ref 98–111)
Creatinine, Ser: 0.81 mg/dL (ref 0.44–1.00)
GFR, Estimated: >60 mL/min (ref >60)
Glucose, Bld: 93 mg/dL (ref 70–99)
Potassium: 3.7 mmol/L (ref 3.5–5.1)
Sodium: 135 mmol/L (ref 135–145)
Total Bilirubin: 0.5 mg/dL (ref 0.0–1.2)
Total Protein: 8.3 g/dL — ABNORMAL HIGH (ref 6.5–8.1)

## 2024-04-23 LAB — HIV ANTIBODY (ROUTINE TESTING W REFLEX): HIV Screen 4th Generation wRfx: NONREACTIVE

## 2024-04-23 LAB — MAGNESIUM: Magnesium: 2 mg/dL (ref 1.7–2.4)

## 2024-04-23 LAB — PHOSPHORUS: Phosphorus: 3.5 mg/dL (ref 2.5–4.6)

## 2024-04-23 LAB — PROTIME-INR
INR: 1 (ref 0.8–1.2)
Prothrombin Time: 13.4 s (ref 11.4–15.2)

## 2024-04-23 MED ORDER — MIDAZOLAM HCL 2 MG/2ML IJ SOLN: 2.0000 mg | INTRAMUSCULAR | Status: DC | PRN

## 2024-04-23 MED ORDER — LAMOTRIGINE 25 MG PO TABS
150.0000 mg | ORAL_TABLET | Freq: Every day | ORAL | Status: AC
  Administered 2024-04-23: 150 mg via ORAL
  Filled 2024-04-23: qty 2

## 2024-04-23 MED ORDER — SODIUM CHLORIDE 0.9% FLUSH
3.0000 mL | Freq: Two times a day (BID) | INTRAVENOUS | Status: DC
  Administered 2024-04-23 – 2024-04-26 (×6): 3 mL via INTRAVENOUS

## 2024-04-23 MED ORDER — LABETALOL HCL 5 MG/ML IV SOLN: 5.0000 mg | INTRAVENOUS | Status: DC | PRN

## 2024-04-23 MED ORDER — ACETAMINOPHEN 325 MG PO TABS
650.0000 mg | ORAL_TABLET | ORAL | Status: DC | PRN
Start: 2024-04-23 — End: 2024-04-27
  Administered 2024-04-23 – 2024-04-25 (×3): 650 mg via ORAL
  Filled 2024-04-23 (×3): qty 2

## 2024-04-23 MED ORDER — ACETAMINOPHEN 650 MG RE SUPP: 650.0000 mg | RECTAL | Status: DC | PRN

## 2024-04-23 MED ORDER — ENOXAPARIN SODIUM 40 MG/0.4ML IJ SOSY
40.0000 mg | PREFILLED_SYRINGE | INTRAMUSCULAR | Status: DC
  Administered 2024-04-23 – 2024-04-24 (×2): 40 mg via SUBCUTANEOUS
  Filled 2024-04-23 (×2): qty 0.4

## 2024-04-23 NOTE — Progress Notes (Addendum)
 Patient reporting right arm pit boil.  Area is tender to palpate, no drainage. Wanted discomfort report to Dr. Shelton; messaged MD; will f/u in the am. May have tylenol  for pain prn per MD.

## 2024-04-23 NOTE — H&P (Signed)
 CC: seizure like activity  History is obtained from: patient, mother at bedside, chart review  HPI: Kathryn Peters is a 28 y.o. 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   Per mother, patient has one seizure described as GTC seizure in September 2024. She then had 2-3 more GTC seizures. Was started on keppra  but switched to LTG due to excessive drowsiness. In May 2025, she had another episode of numbness in her whole body, head dropped and was drooling for few minutes. LTG was increased to 300mg  daily. No further seizure like episodes since. However, patient continues to have daily multiple episodes of whole body numbness/ electric shock like sensation. Per mother she also reports episodes of staring off at times.   Epilepsy Risk Factors:  She had a brief febrile seizure in infancy. 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 No history of CNS infections such as meningitis/encephalitis, significant traumatic brain injury, neurosurgical procedures, or family history of seizures.   Diagnostic Data: 1-hour EEG 08/2023 normal MRI brain without contrast done 09/2023 did not show any acute changes. There was some artifact around right mastoid cells, but within this limitation, no significant abnormalities seen. There was scattered T2 hyperintense signal in the white matter, hippocampi roughly symmetric.   Prior ASMs: Levetiracetam  (drowsiness), immediate-release Lamotrigine  (GI side effects)   ROS: All other systems reviewed and negative except as noted in the HPI.   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 She has never used smokeless tobacco. She reports  that she does not drink alcohol and does not use drugs.  Medications Prior to Admission  Medication Sig Dispense Refill Last Dose/Taking   LamoTRIgine  300 MG TB24 24 hour tablet Take 1 tablet every evening 90 tablet 3 04/22/2024   amoxicillin -clavulanate (AUGMENTIN ) 875-125 MG tablet Take 1 tablet by mouth every 12 (twelve) hours. (Patient not taking: Reported on 02/27/2024) 14 tablet 0    calcium -vitamin D (OSCAL WITH D) 500-5 MG-MCG tablet Take 1 tablet by mouth 2 (two) times daily. (Patient not taking: Reported on 02/27/2024) 60 tablet 0       Exam: Current vital signs: BP 114/74 (BP Location: Left Arm)   Pulse 99   Temp 98.3 F (36.8 C) (Oral)   Resp 20   Ht 5' 4.5 (1.638 m)   Wt 95.3 kg   SpO2 100%   BMI 35.49 kg/m  Vital signs in last 24 hours: Temp:  [98.3 F (36.8 C)-99.3 F (37.4 C)] 98.3 F (36.8 C) (07/14 1948) Pulse Rate:  [86-99] 99 (07/14 1948) Resp:  [20] 20 (07/14 1948) BP: (99-138)/(63-88) 114/74 (07/14 1948) SpO2:  [100 %] 100 % (07/14 1948) Weight:  [95.3 kg] 95.3 kg (07/14 1217)   Physical Exam  Constitutional: Appears well-developed and well-nourished.  Psych: Affect appropriate to situation Neuro: AOx3, CN grossly intact, 5/5 in all extremities, sensory intact, FTN intact   I have reviewed labs in epic and the results pertinent to this consultation are: CBC:  Recent Labs  Lab 04/23/24 1053  WBC 14.9*  NEUTROABS 11.4*  HGB 12.2  HCT 38.2  MCV 84.7  PLT 161    Basic Metabolic Panel:  Lab Results  Component Value Date   NA 135 04/23/2024   K 3.7 04/23/2024   CO2 20 (L) 04/23/2024   GLUCOSE 93 04/23/2024   BUN 9 04/23/2024   CREATININE 0.81 04/23/2024   CALCIUM  8.6 (L) 04/23/2024   GFRNONAA >60 04/23/2024   GFRAA >60 09/17/2015   Lipid Panel: No results found for: LDLCALC HgbA1c: No results found for: HGBA1C Urine Drug Screen:     Component Value Date/Time   LABOPIA NONE DETECTED 04/23/2024 1305   COCAINSCRNUR NONE DETECTED  04/23/2024 1305   LABBENZ NONE DETECTED 04/23/2024 1305   AMPHETMU NONE DETECTED 04/23/2024 1305   THCU NONE DETECTED 04/23/2024 1305   LABBARB NONE DETECTED 04/23/2024 1305    Alcohol Level     Component Value Date/Time   ETH <10 06/19/2023 1830     I have reviewed the images obtained:  MRI Brain wo contrast 02/17/2024: Stable compared to 10/11/2023. No structural correlate for seizure history. Small remote white matter insults with nonspecific pattern and mild involvement. These could relate to patient's history of headache or be related to ischemia, trauma, or demyelination previously.  ASSESSMENT/PLAN: 28yo F admitted to EMU for characterization of seizure like episodes.   Seizure like episodes - Start video eeg monitoring for characterization of spells - Reduce LTG to 150mg  today, will likely stop tomorrow based on eeg findings - HV photic stimulation and slpee deprivation tomorrow - seizure precautions - PRN versed  for clinical sz lasting more than 2 minutes   Kathryn Peters Epilepsy [facility]

## 2024-04-23 NOTE — TOC CM/SW Note (Signed)
 Transition of Care [facility]) - Inpatient Brief Assessment   Patient Details  Name: Winslow Verrill MRN: [id number] Date of Birth: 1996/08/26  Transition of Care [facility] CM/SW Contact:    Andrez JULIANNA George, RN Phone Number: 04/23/2024, 1:33 PM   Clinical Narrative:  Pt is admitted to EMU.  Transition of Care Asessment: Insurance and Status: Insurance coverage has been reviewed Patient has primary care physician: Yes Home environment has been reviewed: home with grandparents   Prior/Current Home Services: No current home services Social Drivers of Health Review: SDOH reviewed no interventions necessary Readmission risk has been reviewed: Yes Transition of care needs: no transition of care needs at this time

## 2024-04-23 NOTE — Progress Notes (Signed)
 vLTM setup  All impedances below 10kohms.  [facility] to monitor

## 2024-04-23 NOTE — Progress Notes (Addendum)
 Received patient from admitting department. Welcomed patient to the room. Oriented patient to room and unit routine. Notified Dr. Shelton of arrival. Patient's mother is present at bedside. Patient is alert and oriented; no acute distress. Fall safety reviewed; bed low and locked for safety.

## 2024-04-24 ENCOUNTER — Inpatient Hospital Stay (HOSPITAL_COMMUNITY): Payer: MEDICAID

## 2024-04-24 NOTE — Progress Notes (Signed)
 Subjective: Kathryn Peters. No concerns except boils are flaring which is a chronic issue for patient.   ROS: negative except above  Examination  Vital signs in last 24 hours: Temp:  [97.8 F (36.6 C)-98.4 F (36.9 C)] 97.8 F (36.6 C) (07/15 1154) Pulse Rate:  [83-99] 97 (07/15 1154) Resp:  [16-20] 19 (07/15 1154) BP: (99-114)/(51-74) 108/51 (07/15 1154) SpO2:  [94 %-100 %] 94 % (07/15 1154)  Constitutional: Appears well-developed and well-nourished.  Psych: Affect appropriate to situation Neuro: AOx3, CN grossly intact, 5/5 in all extremities, sensory intact, FTN intact  Basic Metabolic Panel: Recent Labs  Lab 04/23/24 1053  NA 135  K 3.7  CL 103  CO2 20*  GLUCOSE 93  BUN 9  CREATININE 0.81  CALCIUM  8.6*  MG 2.0  PHOS 3.5    CBC: Recent Labs  Lab 04/23/24 1053  WBC 14.9*  NEUTROABS 11.4*  HGB 12.2  HCT 38.2  MCV 84.7  PLT 161     Coagulation Studies: Recent Labs    04/23/24 1053  LABPROT 13.4  INR 1.0    Imaging No new imaging   ASSESSMENT AND PLAN:28yo F admitted to EMU for characterization of seizure like episodes.    Seizure like episodes - Continue video eeg monitoring for characterization of spells - Stop LTG today - HV photic stimulation and sleep deprivation today - seizure precautions - PRN versed  for clinical sz lasting more than 2 minutes    I personally spent a total of 36 minutes in the care of the patient today including getting/reviewing separately obtained history, performing a medically appropriate exam/evaluation, counseling and educating, placing orders, referring and communicating with other health care professionals, documenting clinical information in the EHR, independently interpreting results, and coordinating care.           Arlin Krebs Epilepsy [facility] For questions after 5pm please refer to AMION to reach the Neurologist on call

## 2024-04-24 NOTE — Procedures (Signed)
 Patient Name: Kathryn Peters  MRN: [id number]  Epilepsy Attending: Arlin MALVA Krebs  Referring Physician/Provider: Krebs Arlin MALVA, MD  Duration: 04/23/2024 1115 to 04/24/2024 1115  Patient history:  28yo F admitted to EMU for characterization of seizure like episodes. EEG to evaluate for seizure  Level of alertness: Awake, asleep  AEDs during EEG study: LTG  Technical aspects: This EEG study was done with scalp electrodes positioned according to the 10-20 International system of electrode placement. Electrical activity was reviewed with band pass filter of 1-70Hz , sensitivity of 7 uV/mm, display speed of 45mm/sec with a 60Hz  notched filter applied as appropriate. EEG data were recorded continuously and digitally stored.  Video monitoring was available and reviewed as appropriate.  Description: The posterior dominant rhythm consists of 8-9 Hz activity of moderate voltage (25-35 uV) seen predominantly in posterior head regions, symmetric and reactive to eye opening and eye closing. Sleep was characterized by vertex waves, sleep spindles (12 to 14 Hz), maximal frontocentral region.  Generalized sharp transients were noted. No EEG change was seen during hyperventilation. Photic driving was not seen during photic stimulation.  IMPRESSION: This study is within normal limits. No seizures or definite epileptiform discharges were seen throughout the recording.  Kristeen Lantz O Charlee Squibb

## 2024-04-24 NOTE — Progress Notes (Signed)
 HV/PS complete.

## 2024-04-25 ENCOUNTER — Encounter (HOSPITAL_COMMUNITY): Payer: MEDICAID

## 2024-04-25 ENCOUNTER — Inpatient Hospital Stay (HOSPITAL_COMMUNITY): Payer: MEDICAID

## 2024-04-25 MED ORDER — KETOROLAC TROMETHAMINE 30 MG/ML IJ SOLN
30.0000 mg | Freq: Once | INTRAMUSCULAR | Status: AC
  Administered 2024-04-25: 30 mg via INTRAVENOUS
  Filled 2024-04-25: qty 1

## 2024-04-25 NOTE — Plan of Care (Signed)
   Problem: Education: Goal: Knowledge of General Education information will improve Description Including pain rating scale, medication(s)/side effects and non-pharmacologic comfort measures Outcome: Progressing

## 2024-04-25 NOTE — Progress Notes (Signed)
 LTM maint complete - no skin breakdown seen. Serviced Pz P3 P4 A2 Fz Re-bundled leads  All leads attached. Monitored by [facility].

## 2024-04-25 NOTE — Progress Notes (Signed)
 EMU LTM maint complete - no skin breakdown under: FZ, GRND, 02

## 2024-04-25 NOTE — Progress Notes (Signed)
 Subjective: No acute events overnight.  Started menstruation therefore having some cramps today.  Per mother her mood has been little more irritable.  Did not sleep deprivation last night.  No new concerns.  ROS: negative except above  Examination  Vital signs in last 24 hours: Temp:  [97.8 F (36.6 C)-99.3 F (37.4 C)] 98.3 F (36.8 C) (07/16 0740) Pulse Rate:  [76-104] 76 (07/16 0305) Resp:  [16-19] 16 (07/16 0052) BP: (99-120)/(51-85) 107/74 (07/16 0740) SpO2:  [94 %-100 %] 99 % (07/16 0305)  Constitutional: Appears well-developed and well-nourished.  Psych: Affect appropriate to situation Neuro: AOx3, CN grossly intact, 5/5 in all extremities, sensory intact, FTN intact  Basic Metabolic Panel: Recent Labs  Lab 04/23/24 1053  NA 135  K 3.7  CL 103  CO2 20*  GLUCOSE 93  BUN 9  CREATININE 0.81  CALCIUM  8.6*  MG 2.0  PHOS 3.5    CBC: Recent Labs  Lab 04/23/24 1053  WBC 14.9*  NEUTROABS 11.4*  HGB 12.2  HCT 38.2  MCV 84.7  PLT 161     Coagulation Studies: Recent Labs    04/23/24 1053  LABPROT 13.4  INR 1.0    Imaging No new imaging     ASSESSMENT AND PLAN:28yo F admitted to EMU for characterization of seizure like episodes.    Seizure like episodes - Continue video eeg monitoring for characterization of spells - No antiseizure medications today -Discussed overnight EEG findings - seizure precautions - PRN versed  for clinical sz lasting more than 2 minutes  Menstruation cramps - Will give one-time dose of IV Toradol , as needed Tylenol      I personally spent a total of 35 minutes in the care of the patient today including getting/reviewing separately obtained history, performing a medically appropriate exam/evaluation, counseling and educating, placing orders, referring and communicating with other health care professionals, documenting clinical information in the EHR, independently interpreting results, and coordinating care.        Arlin Krebs Epilepsy [facility] For questions after 5pm please refer to AMION to reach the Neurologist on call

## 2024-04-25 NOTE — Procedures (Signed)
 Patient Name: Kathryn Peters  MRN: [id number]  Epilepsy Attending: Arlin MALVA Krebs  Referring Physician/Provider: Krebs Arlin MALVA, MD  Duration: 04/24/2024 1115 to 04/25/2024 1115   Patient history:  28yo F admitted to EMU for characterization of seizure like episodes. EEG to evaluate for seizure   Level of alertness: Awake, asleep   AEDs during EEG study: None   Technical aspects: This EEG study was done with scalp electrodes positioned according to the 10-20 International system of electrode placement. Electrical activity was reviewed with band pass filter of 1-70Hz , sensitivity of 7 uV/mm, display speed of 36mm/sec with a 60Hz  notched filter applied as appropriate. EEG data were recorded continuously and digitally stored.  Video monitoring was available and reviewed as appropriate.   Description: The posterior dominant rhythm consists of 8-9 Hz activity of moderate voltage (25-35 uV) seen predominantly in posterior head regions, symmetric and reactive to eye opening and eye closing. Sleep was characterized by vertex waves, sleep spindles (12 to 14 Hz), maximal frontocentral region.     IMPRESSION: This study is within normal limits. No seizures or definite epileptiform discharges were seen throughout the recording.   Kathryn Peters

## 2024-04-26 ENCOUNTER — Inpatient Hospital Stay (HOSPITAL_COMMUNITY): Payer: MEDICAID

## 2024-04-26 MED ORDER — LAMOTRIGINE ER 100 MG PO TB24
300.0000 mg | ORAL_TABLET | Freq: Every day | ORAL | Status: DC
  Administered 2024-04-26: 300 mg via ORAL
  Filled 2024-04-26 (×2): qty 3

## 2024-04-26 NOTE — Plan of Care (Signed)
  Problem: Education: Goal: Knowledge of General Education information will improve Description: Including pain rating scale, medication(s)/side effects and non-pharmacologic comfort measures Outcome: Progressing   Problem: Health Behavior/Discharge Planning: Goal: Ability to manage health-related needs will improve Outcome: Progressing   Problem: Clinical Measurements: Goal: Ability to maintain clinical measurements within normal limits will improve Outcome: Progressing Goal: Will remain free from infection Outcome: Progressing Goal: Diagnostic test results will improve Outcome: Progressing Goal: Respiratory complications will improve Outcome: Progressing Goal: Cardiovascular complication will be avoided Outcome: Progressing   Problem: Activity: Goal: Risk for activity intolerance will decrease Outcome: Progressing   Problem: Nutrition: Goal: Adequate nutrition will be maintained Outcome: Progressing   Problem: Coping: Goal: Level of anxiety will decrease Outcome: Progressing   Problem: Pain Managment: Goal: General experience of comfort will improve and/or be controlled Outcome: Progressing   Problem: Safety: Goal: Ability to remain free from injury will improve Outcome: Progressing   Problem: Skin Integrity: Goal: Risk for impaired skin integrity will decrease Outcome: Progressing   Problem: Coping: Goal: Ability to adjust to condition or change in health will improve Outcome: Progressing Goal: Ability to identify appropriate support needs will improve Outcome: Progressing   Problem: Health Behavior/Discharge Planning: Goal: Compliance with prescribed medication regimen will improve Outcome: Progressing   Problem: Medication: Goal: Risk for medication side effects will decrease Outcome: Progressing   Problem: Clinical Measurements: Goal: Complications related to the disease process, condition or treatment will be avoided or minimized Outcome:  Progressing Goal: Diagnostic test results will improve Outcome: Progressing   Problem: Safety: Goal: Verbalization of understanding the information provided will improve Outcome: Progressing   Problem: Self-Concept: Goal: Level of anxiety will decrease Outcome: Progressing Goal: Ability to verbalize feelings about condition will improve Outcome: Progressing

## 2024-04-26 NOTE — Progress Notes (Signed)
 EMU LTM maint complete - no skin breakdown under: O1,A1,C3,P3

## 2024-04-26 NOTE — Procedures (Signed)
 Patient Name: Kathryn Peters  MRN: [id number]  Epilepsy Attending: Arlin MALVA Krebs  Referring Physician/Provider: Krebs Arlin MALVA, MD  Duration: 04/25/2024 1115 to 04/26/2024 1115   Patient history:  28yo F admitted to EMU for characterization of seizure like episodes. EEG to evaluate for seizure   Level of alertness: Awake, asleep   AEDs during EEG study: None   Technical aspects: This EEG study was done with scalp electrodes positioned according to the 10-20 International system of electrode placement. Electrical activity was reviewed with band pass filter of 1-70Hz , sensitivity of 7 uV/mm, display speed of 38mm/sec with a 60Hz  notched filter applied as appropriate. EEG data were recorded continuously and digitally stored.  Video monitoring was available and reviewed as appropriate.   Description: The posterior dominant rhythm consists of 8-9 Hz activity of moderate voltage (25-35 uV) seen predominantly in posterior head regions, symmetric and reactive to eye opening and eye closing. Sleep was characterized by vertex waves, sleep spindles (12 to 14 Hz), maximal frontocentral region.     IMPRESSION: This study is within normal limits. No seizures or definite epileptiform discharges were seen throughout the recording.   Tobechukwu Emmick O Cyruss Arata

## 2024-04-26 NOTE — Progress Notes (Signed)
 Subjective: NAEO. No new concerns.  ROS: negative except above  Examination  Vital signs in last 24 hours: Temp:  [97.7 F (36.5 C)-98.7 F (37.1 C)] 98 F (36.7 C) (07/17 1516) Pulse Rate:  [74-106] 81 (07/17 1516) Resp:  [16-20] 17 (07/17 1516) BP: (93-130)/(46-83) 108/67 (07/17 1516) SpO2:  [93 %-100 %] 98 % (07/17 1516)  Constitutional: Appears well-developed and well-nourished.  Psych: Affect appropriate to situation Neuro: AOx3, CN grossly intact, 5/5 in all extremities, sensory intact, FTN intact  Basic Metabolic Panel: Recent Labs  Lab 04/23/24 1053  NA 135  K 3.7  CL 103  CO2 20*  GLUCOSE 93  BUN 9  CREATININE 0.81  CALCIUM  8.6*  MG 2.0  PHOS 3.5    CBC: Recent Labs  Lab 04/23/24 1053  WBC 14.9*  NEUTROABS 11.4*  HGB 12.2  HCT 38.2  MCV 84.7  PLT 161     Coagulation Studies: No results for input(s): LABPROT, INR in the last 72 hours.  Imaging No new imaging     ASSESSMENT AND PLAN:28yo F admitted to EMU for characterization of seizure like episodes.    Seizure like episodes - Continue video eeg monitoring for characterization of spells - Resume LTG 300mg  daily - Discussed overnight EEG findings - seizure precautions - PRN versed  for clinical sz lasting more than 2 minutes      I personally spent a total of 36 minutes in the care of the patient today including getting/reviewing separately obtained history, performing a medically appropriate exam/evaluation, counseling and educating, placing orders, referring and communicating with other health care professionals, documenting clinical information in the EHR, independently interpreting results, and coordinating care.     Arlin Krebs Epilepsy [facility] For questions after 5pm please refer to AMION to reach the Neurologist on call

## 2024-04-26 NOTE — Progress Notes (Addendum)
 Patient reports feeling dizzy and having floaters.  VS documented.

## 2024-04-27 ENCOUNTER — Telehealth (HOSPITAL_COMMUNITY): Payer: Self-pay | Admitting: Pharmacy Technician

## 2024-04-27 ENCOUNTER — Inpatient Hospital Stay (HOSPITAL_COMMUNITY): Payer: MEDICAID

## 2024-04-27 ENCOUNTER — Other Ambulatory Visit (HOSPITAL_COMMUNITY): Payer: Self-pay

## 2024-04-27 MED ORDER — VALTOCO 20 MG DOSE 2 X 10 MG/0.1ML NA LQPK
20.0000 mg | NASAL | 2 refills | Status: DC | PRN
  Filled 2024-04-27: qty 5, 5d supply, fill #0

## 2024-04-27 NOTE — Procedures (Addendum)
 Patient Name: Kathryn Peters  MRN: [id number]  Epilepsy Attending: Arlin MALVA Krebs  Referring Physician/Provider: Krebs Arlin MALVA, MD  Duration: 04/26/2024 1115 to 04/27/2024 1008   Patient history:  28yo F admitted to EMU for characterization of seizure like episodes. EEG to evaluate for seizure   Level of alertness: Awake, asleep   AEDs during EEG study: None   Technical aspects: This EEG study was done with scalp electrodes positioned according to the 10-20 International system of electrode placement. Electrical activity was reviewed with band pass filter of 1-70Hz , sensitivity of 7 uV/mm, display speed of 30mm/sec with a 60Hz  notched filter applied as appropriate. EEG data were recorded continuously and digitally stored.  Video monitoring was available and reviewed as appropriate.   Description: The posterior dominant rhythm consists of 8-9 Hz activity of moderate voltage (25-35 uV) seen predominantly in posterior head regions, symmetric and reactive to eye opening and eye closing. Sleep was characterized by vertex waves, sleep spindles (12 to 14 Hz), maximal frontocentral region.     IMPRESSION: This study is within normal limits. No seizures or definite epileptiform discharges were seen throughout the recording.   Aliah Eriksson O Jonalyn Sedlak

## 2024-04-27 NOTE — Progress Notes (Signed)
 Pt DC home with mother. Stable and all belongings returned. Dc instructions and meds reviewed

## 2024-04-27 NOTE — Discharge Instructions (Signed)
 You were admitted to epilepsy monitoring unit between 04/23/2024 to 04/27/2024.  During this time you had continuous video EEG monitoring.  Your antiseizure medication was held.  Stimulation, hyperventilation and sleep deprivation were performed.  No typical events were recorded.  Your EEG was within normal limits.  For now we recommend continuing your lamotrigine  at current dose.  However if episodes get worse, we may need to repeat EMU stay.  Please continue seizure precautions and driving restrictions and follow-up with Dr. Georjean.

## 2024-04-27 NOTE — Progress Notes (Signed)
EMU LTM EEG discontinued - no skin breakdown at unhook.  °

## 2024-04-27 NOTE — Telephone Encounter (Signed)
 Pharmacy Patient Advocate Encounter  Insurance verification completed.    The patient is insured through [employer] [place].     Ran test claim for Valtoco 20mg  dose and the current 30 day co-pay is $4.00.   This test claim was processed through [facility]- copay amounts may vary at other pharmacies due to pharmacy/plan contracts, or as the patient moves through the different stages of their insurance plan.

## 2024-04-27 NOTE — Discharge Summary (Addendum)
 Physician Discharge Summary  Patient ID: Juno Alers MRN: [id number] DOB/AGE: May 14, 1996 28 y.o.  Admit date: 04/23/2024 Discharge date: 04/27/2024  Admission Diagnoses: Seizure  Discharge Diagnoses: Seizure  Discharged Condition: stable  Hospital Course: Ms Kofman was admitted to epilepsy monitoring unit between 04/23/2024 through 04/27/2024.  During this time, she underwent continuous video EEG monitoring.  Lamotrigine  was held.  Keep deprivation, photic stimulation and hyperventilation were performed.  No typical events were recorded.  EEG was within normal limits.  The semiology of patient's episodes is still concerning for epileptic seizures.  Therefore for now would recommend continuing lamotrigine  300 mg daily.  Please continue seizure precautions including no driving and follow-up with Dr. Georjean.  Consults: None  Significant Diagnostic Studies: Video eeg  Patient history:  28yo F admitted to EMU for characterization of seizure like episodes. EEG to evaluate for seizure   Level of alertness: Awake, asleep    Technical aspects: This EEG study was done with scalp electrodes positioned according to the 10-20 International system of electrode placement. Electrical activity was reviewed with band pass filter of 1-70Hz , sensitivity of 7 uV/mm, display speed of 36mm/sec with a 60Hz  notched filter applied as appropriate. EEG data were recorded continuously and digitally stored.  Video monitoring was available and reviewed as appropriate.   Description: The posterior dominant rhythm consists of 8-9 Hz activity of moderate voltage (25-35 uV) seen predominantly in posterior head regions, symmetric and reactive to eye opening and eye closing. Sleep was characterized by vertex waves, sleep spindles (12 to 14 Hz), maximal frontocentral region.  Generalized sharp transients were noted. No EEG change was seen during hyperventilation. Photic driving was not seen during photic stimulation.    IMPRESSION: This study is within normal limits. No seizures or definite epileptiform discharges were seen throughout the recording.   Phoebie Shad O Adna Nofziger   Treatments: Continue lamotrigine  300 mg extended release daily  Discharge Exam: Blood pressure 113/78, pulse 80, temperature 97.6 F (36.4 C), resp. rate 18, height 5' 4.5 (1.638 m), weight 95.3 kg, SpO2 95%.  Constitutional: Appears well-developed and well-nourished.  Psych: Affect appropriate to situation Neuro: AOx3, CN grossly intact, 5/5 in all extremities, sensory intact, FTN intact  Discharge disposition: 01-Home or Self Care   Discharge Instructions     Call MD for:   Complete by: As directed    If patient has another seizure, call 911 and bring them back to the ED if: A.  The seizure lasts longer than 5 minutes.      B.  The patient doesn't wake shortly after the seizure or has new problems such as difficulty seeing, speaking or moving following the seizure C.  The patient was injured during the seizure D.  The patient has a temperature over 102 F (39C) E.  The patient vomited during the seizure and now is having trouble breathing      Diet - low sodium heart healthy   Complete by: As directed    Discharge instructions   Complete by: As directed    During the Seizure   - First, ensure adequate ventilation and place patients on the floor on their left side  Loosen clothing around the neck and ensure the airway is patent. If the patient is clenching the teeth, do not force the mouth open with any object as this can cause severe damage - Remove all items from the surrounding that can be hazardous. The patient may be oblivious to what's happening and may not even know  what he or she is doing. If the patient is confused and wandering, either gently guide him/her away and block access to outside areas - Reassure the individual and be comforting - Call 911. In most cases, the seizure ends before EMS arrives. However,  there are cases when seizures may last over 3 to 5 minutes. Or the individual may have developed breathing difficulties or severe injuries. If a pregnant patient or a person with diabetes develops a seizure, it is prudent to call an ambulance.   After the Seizure (Postictal Stage)   After a seizure, most patients experience confusion, fatigue, muscle pain and/or a headache. Thus, one should permit the individual to sleep. For the next few days, reassurance is essential. Being calm and helping reorient the person is also of importance.   Most seizures are painless and end spontaneously. Seizures are not harmful to others but can lead to complications such as stress on the lungs, brain and the heart. Individuals with prior lung problems may develop labored breathing and respiratory distress.        Increase activity slowly   Complete by: As directed    Other Restrictions   Complete by: As directed    Seizure precautions: Per [place]  DMV statutes, patients with seizures are not allowed to drive until they have been seizure-free for six months and cleared by a physician    Use caution when using heavy equipment or power tools. Avoid working on ladders or at heights. Take showers instead of baths. Ensure the water temperature is not too high on the home water heater. Do not go swimming alone. Do not lock yourself in a room alone (i.e. bathroom). When caring for infants or small children, sit down when holding, feeding, or changing them to minimize risk of injury to the child in the event you have a seizure. Maintain good sleep hygiene. Avoid alcohol.      Allergies as of 04/27/2024       Reactions   Peach [prunus Persica] Swelling   Tongue swelling   Pineapple Swelling   Tongue swelling   Strawberry (diagnostic) Swelling   Tongue swelling        Medication List     TAKE these medications    calcium  carbonate 750 MG chewable tablet Commonly known as: TUMS EX Chew 750 mg by  mouth with breakfast, with lunch, and with evening meal.   ibuprofen  200 MG tablet Commonly known as: ADVIL  Take 400 mg by mouth every 6 (six) hours as needed for headache, moderate pain (pain score 4-6) or fever.   LamoTRIgine  300 MG Tb24 24 hour tablet Take 1 tablet every evening   Valtoco 10 MG Dose 10 MG/0.1ML Liqd Generic drug: diazePAM Place 20 mg into the nose as needed (Seizure lasting more than 2 minutes).         I personally spent a total of 38 minutes in the care of the patient today including getting/reviewing separately obtained history, performing a medically appropriate exam/evaluation, counseling and educating, placing orders, referring and communicating with other health care professionals, documenting clinical information in the EHR, independently interpreting results, and coordinating care.         Signed: Lachlan Pelto O Nachman Sundt 04/27/2024, 9:59 AM

## 2024-06-04 ENCOUNTER — Ambulatory Visit (HOSPITAL_COMMUNITY): Payer: MEDICAID | Admitting: Mental Health

## 2024-06-04 DIAGNOSIS — F411 Generalized anxiety disorder: Secondary | ICD-10-CM | POA: Diagnosis not present

## 2024-06-04 DIAGNOSIS — F322 Major depressive disorder, single episode, severe without psychotic features: Secondary | ICD-10-CM
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
## 2024-06-21 ENCOUNTER — Encounter (HOSPITAL_COMMUNITY): Payer: Self-pay

## 2024-06-22 ENCOUNTER — Ambulatory Visit (HOSPITAL_COMMUNITY): Payer: Self-pay | Admitting: Psychiatry

## 2024-06-29 ENCOUNTER — Encounter: Payer: Self-pay | Admitting: Neurology

## 2024-06-29 ENCOUNTER — Ambulatory Visit (INDEPENDENT_AMBULATORY_CARE_PROVIDER_SITE_OTHER): Payer: MEDICAID | Admitting: Neurology

## 2024-06-29 VITALS — BP 129/83 | HR 94 | Ht 64.5 in | Wt 205.0 lb

## 2024-06-29 DIAGNOSIS — G40909 Epilepsy, unspecified, not intractable, without status epilepticus: Secondary | ICD-10-CM

## 2024-06-29 MED ORDER — LAMOTRIGINE ER 300 MG PO TB24: ORAL_TABLET | ORAL | 3 refills | Status: AC

## 2024-06-29 NOTE — Patient Instructions (Signed)
 Good to see you doing better!  Continue Lamotrigine  ER 300mg : take 1 tablet every night  2. Please reschedule Psychiatry appointment, continue therapy sessions  3. Follow-up in 6 months, call for any changes   Seizure Precautions: 1. If medication has been prescribed for you to prevent seizures, take it exactly as directed.  Do not stop taking the medicine without talking to your doctor first, even if you have not had a seizure in a long time.   2. Avoid activities in which a seizure would cause danger to yourself or to others.  Don't operate dangerous machinery, swim alone, or climb in high or dangerous places, such as on ladders, roofs, or girders.  Do not drive unless your doctor says you may.  3. If you have any warning that you may have a seizure, lay down in a safe place where you can't hurt yourself.    4.  No driving for 6 months from last seizure, as per [place]  state law.   Please refer to the following link on the Epilepsy Foundation of America's website for more information: http://www.epilepsyfoundation.org/answerplace/Social/driving/drivingu.cfm   5.  Maintain good sleep hygiene. Avoid alcohol.  6.  Notify your neurology if you are planning pregnancy or if you become pregnant.  7.  Contact your doctor if you have any problems that may be related to the medicine you are taking.  8.  Call 911 and bring the patient back to the ED if:        A.  The seizure lasts longer than 5 minutes.       B.  The patient doesn't awaken shortly after the seizure  C.  The patient has new problems such as difficulty seeing, speaking or moving  D.  The patient was injured during the seizure  E.  The patient has a temperature over 102 F (39C)  F.  The patient vomited and now is having trouble breathing
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
## 2024-07-20 ENCOUNTER — Ambulatory Visit (HOSPITAL_COMMUNITY): Payer: MEDICAID | Admitting: Mental Health

## 2024-07-20 ENCOUNTER — Encounter (HOSPITAL_COMMUNITY): Payer: Self-pay

## 2024-07-20 DIAGNOSIS — F322 Major depressive disorder, single episode, severe without psychotic features: Secondary | ICD-10-CM | POA: Diagnosis not present

## 2024-07-20 NOTE — Progress Notes (Signed)
   THERAPIST PROGRESS NOTE Virtual Visit via Video Note  I connected with Marijo Leann Cheeks on 07/20/2024 at 11:00 AM EDT by a video enabled telemedicine application and verified that I am speaking with the correct person using two identifiers.  Location: Patient: home address on file Provider: remote office   I discussed the limitations of evaluation and management by telemedicine and the availability of in person appointments. The patient expressed understanding and agreed to proceed.  I discussed the assessment and treatment plan with the patient. The patient was provided an opportunity to ask questions and all were answered. The patient agreed with the plan and demonstrated an understanding of the instructions.   The patient was advised to call back or seek an in-person evaluation if the symptoms worsen or if the condition fails to improve as anticipated.  I provided 20 minutes of non-face-to-face time during this encounter.   Ty Bernice Savant, [facility]   Session Time: 11:01 am (   Participation Level: Minimal  Behavioral Response: Fairly GroomedAlertDysphoric  Type of Therapy: Individual Therapy  Treatment Goals addressed: Boring STG: Vashon will increase social interaction AEB presentation in community x 1 weekly within the next 90 days.   ProgressTowards Goals: Initial  Interventions: Supportive  Summary: Chantae Soo is a 28 y.o. female who presents with dx of major depression. Presents for session alert and oriented. Mood and affect blunted. Speech minimal; lacking spontaneous output. Shares for moods to have been normal and shares chief complaint of boredom. Denies engagement with others and shares to spend large amount of time in her room and does not interact much. Difficulty in completion of  PHQ and GAD due to identifying scores outside of 0 to 3 range, requiring several reviews of range of scores with clinician. Denies feelings of depression,  however PHQ scores indicate high level. Unclear of understanding of range of scores. 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 Shares desire get out more by myself. Shares barrier of seizures and in need of medical bractlet. Agrees for clinician to speak to mother (DPR on file) to discuss referral to [facility] program. Agrees to treatment plan  Suicidal/Homicidal: Nowithout intent/plan  Therapist Response: Therapist engaged Insurance risk surveyor in tele-therapy session. Complete check in and assessed for current level of functioning and sxs present. Engaged in exploring desire for therapy and reviewed informed consent and bounds of confidentiality. Explored sxs of depression and current areas in which she would like to see improvement in daily life. Completed and reviewed PHQ 9 and explored inconsistency of verbal reports of denying current concerns for depression and current PHQ scores. Educated on presentation to [facility] and discussed referral. No safety concerns reported.  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 Patient/Guardian was advised if they have not already done so to contact the registration department to sign all necessary forms in order for us  to release information regarding their care.   Consent: Patient/Guardian gives verbal consent for treatment and assignment of benefits for services provided during this visit. Patient/Guardian expressed understanding and agreed to proceed.   Ty Bernice Warwick, [facility] 07/20/2024

## 2024-07-25 ENCOUNTER — Telehealth (HOSPITAL_COMMUNITY): Payer: Self-pay | Admitting: Mental Health

## 2024-07-25 NOTE — Telephone Encounter (Signed)
 Therapist contacted Pt mother- Massa Pe (DPR on file) and educated on pt and therapist discussion on PSR referral. PSR referral sent.

## 2024-08-17 ENCOUNTER — Telehealth: Payer: Self-pay | Admitting: Neurology

## 2024-08-17 NOTE — Telephone Encounter (Signed)
 [employer] Call ID: [id number]  Caller: Lupita Cheeks;  Reason for the call: Caller states patient had seizures in the past and is calling for a bracelet.

## 2024-08-17 NOTE — Telephone Encounter (Signed)
 Pt father called informed that we will get the RX for the embrace 2 placed in the mail for them

## 2024-08-17 NOTE — Telephone Encounter (Signed)
 Carl(DAD) is calling back regarding bracelet. He is wanting to know how he can get a bracelet that will notify the parents when Early is having a seizure.   PH: [phone].

## 2024-08-17 NOTE — Telephone Encounter (Signed)
 They are talking about the EpiMonitor/Embrace2 watch to help detect convulsions and alert family members. She will need the signed prescription we have copies of

## 2024-08-24 ENCOUNTER — Encounter: Payer: Self-pay | Admitting: Neurology

## 2024-08-26 ENCOUNTER — Encounter (HOSPITAL_BASED_OUTPATIENT_CLINIC_OR_DEPARTMENT_OTHER): Payer: Self-pay | Admitting: Emergency Medicine

## 2024-08-26 ENCOUNTER — Emergency Department (HOSPITAL_BASED_OUTPATIENT_CLINIC_OR_DEPARTMENT_OTHER)
Admission: EM | Admit: 2024-08-26 | Discharge: 2024-08-26 | Disposition: A | Discharge status: Home / Self Care | Payer: MEDICAID | Attending: Emergency Medicine | Admitting: Emergency Medicine

## 2024-08-26 ENCOUNTER — Other Ambulatory Visit: Payer: Self-pay

## 2024-08-26 DIAGNOSIS — J069 Acute upper respiratory infection, unspecified: Secondary | ICD-10-CM | POA: Insufficient documentation

## 2024-08-26 DIAGNOSIS — R059 Cough, unspecified: Secondary | ICD-10-CM | POA: Diagnosis present

## 2024-08-26 MED ORDER — DEXAMETHASONE 4 MG PO TABS
10.0000 mg | ORAL_TABLET | Freq: Once | ORAL | Status: AC
  Administered 2024-08-26: 10 mg via ORAL
  Filled 2024-08-26: qty 3

## 2024-08-26 MED ORDER — ALBUTEROL SULFATE HFA 108 (90 BASE) MCG/ACT IN AERS
2.0000 | INHALATION_SPRAY | Freq: Once | RESPIRATORY_TRACT | Status: AC
Start: 2024-08-26 — End: 2024-08-26
  Administered 2024-08-26: 2 via RESPIRATORY_TRACT
  Filled 2024-08-26: qty 6.7

## 2024-08-26 NOTE — ED Notes (Signed)
 Walked on room air around unit perimeter without event. SpO2 remained 97-100%. HR elevated to 122 (up from 98). Slight increase in sob. NAD.

## 2024-08-26 NOTE — ED Provider Notes (Signed)
 [place] EMERGENCY DEPARTMENT AT [facility] Provider Note   CSN: [id number] Arrival date & time: 08/26/24  0545     Patient presents with: Cough and Shortness of Breath   Jacklynn Tashiya Souders is a 28 y.o. female.   HPI     This is a 29 year old female who presents with cough, congestion, shortness of breath.  Patient was seen and evaluated on the 12th for similar symptoms.  Negative viral testing and strep testing.  Reports going home with Tessalon  Perles and Zyrtec.  Notes indicate that she was also taking dexamethasone  but states that she does not have this prescription.  No active wheezing on exam but patient states she has propensity to wheeze when she gets upper respiratory infections.  Mother helps provide some of the history.  She has not had any fevers at home.  She is currently on penicillin for dental infection.  Prior to Admission medications   Medication Sig Start Date End Date Taking? Authorizing Provider  calcium  carbonate (TUMS EX) 750 MG chewable tablet Chew 750 mg by mouth with breakfast, with lunch, and with evening meal.    [provider]  ibuprofen  (ADVIL ) 200 MG tablet Take 400 mg by mouth every 6 (six) hours as needed for headache, moderate pain (pain score 4-6) or fever.    [provider]  LamoTRIgine  300 MG TB24 24 hour tablet Take 1 tablet every evening 06/29/24   Georjean Darice HERO, MD    Allergies: Tully [prunus persica], Pineapple, and Strawberry (diagnostic)    Review of Systems  Constitutional:  Negative for fever.  HENT:  Positive for congestion and sore throat.   Respiratory:  Positive for cough and shortness of breath.   Cardiovascular:  Negative for chest pain.  All other systems reviewed and are negative.   Updated Vital Signs BP (!) 130/91   Pulse (!) 105   Temp 98.3 F (36.8 C) (Oral)   Resp 20   Wt 93 kg   LMP 08/12/2024 (Exact Date)   SpO2 99%   BMI 34.65 kg/m   Physical Exam Vitals and nursing note  reviewed.  Constitutional:      Appearance: She is well-developed. She is obese. She is not ill-appearing.  HENT:     Head: Normocephalic and atraumatic.     Mouth/Throat:     Mouth: Mucous membranes are moist.     Comments: Slightly enlarged symmetric tonsils bilaterally, no exudate or erythema noted, uvula midline Eyes:     Pupils: Pupils are equal, round, and reactive to light.  Cardiovascular:     Rate and Rhythm: Regular rhythm. Tachycardia present.     Heart sounds: Normal heart sounds.  Pulmonary:     Effort: Pulmonary effort is normal. No respiratory distress.     Breath sounds: No wheezing or rhonchi.  Abdominal:     Palpations: Abdomen is soft.     Tenderness: There is no abdominal tenderness.  Musculoskeletal:     Cervical back: Neck supple.  Skin:    General: Skin is warm and dry.  Neurological:     Mental Status: She is alert and oriented to person, place, and time.     (all labs ordered are listed, but only abnormal results are displayed) Labs Reviewed - No data to display  EKG: None  Radiology: No results found.   Procedures   Medications Ordered in the ED  dexamethasone  (DECADRON ) tablet 10 mg (has no administration in time range)  albuterol  (VENTOLIN  HFA) 108 (90  Base) MCG/ACT inhaler 2 puff (2 puffs Inhalation Given 08/26/24 9386)                                    Medical Decision Making Risk Prescription drug management.   This patient presents to the ED for concern of ongoing upper respiratory symptoms, this involves an extensive number of treatment options, and is a complaint that carries with it a high risk of complications and morbidity.  I considered the following differential and admission for this acute, potentially life threatening condition.  The differential diagnosis includes viral illness, bronchitis, asthma exacerbation, pneumonia  MDM:    This is a 28 year old female who presents with persistent upper respiratory symptoms.   She is nontoxic.  Slightly tachycardic to 105.  No active wheezing or respiratory distress on evaluation.  She is already on penicillin for dental infection and is on Tessalon  Perles.  She reports that she is not taking Decadron  although notes from urgent care indicate that she was given a prescription.  She was given a dose of Decadron  for tonsillar swelling here.  That would likely help any inflammation as well of her airways.  She was able to ambulate maintain a pulse ox.  She was given an albuterol  inhaler although she is not wheezing at this time.  This may help some with cough.  Discussed with the patient and her mother ongoing supportive measures.  Recommend addition of Flonase  and nasal saline.  Given that she is already on penicillin for dental infection, do not feel she needs chest x-ray as her breath sounds are clear and penicillin would likely cover any underlying pneumonia.  (Labs, imaging, consults)  Labs: I Ordered, and personally interpreted labs.  The pertinent results include: No additional viral testing  Imaging Studies ordered: I ordered imaging studies including none I independently visualized and interpreted imaging. I agree with the radiologist interpretation  Additional history obtained from chart review.  External records from outside source obtained and reviewed including urgent care notes  Cardiac Monitoring: The patient was maintained on a cardiac monitor.  If on the cardiac monitor, I personally viewed and interpreted the cardiac monitored which showed an underlying rhythm of: Sinus  Reevaluation: After the interventions noted above, I reevaluated the patient and found that they have :improved  Social Determinants of Health:  lives with mother  Disposition: Discharge  Co morbidities that complicate the patient evaluation  Past Medical History:  Diagnosis Date   Leona George's syndrome (HCC)      Medicines Meds ordered this encounter  Medications    albuterol  (VENTOLIN  HFA) 108 (90 Base) MCG/ACT inhaler 2 puff   dexamethasone  (DECADRON ) tablet 10 mg    I have reviewed the patients home medicines and have made adjustments as needed  Problem List / ED Course: Problem List Items Addressed This Visit   None Visit Diagnoses       Viral URI with cough    -  Primary                Final diagnoses:  Viral URI with cough    ED Discharge Orders     None          Bari Charmaine FALCON, MD 08/26/24 [phone]

## 2024-08-26 NOTE — ED Triage Notes (Addendum)
 Pt in with 2 days of cough, congestion and reported sob. Pt states she is wheezing, is out of her albuterol  inhaler. Reports low grade fevers at home. Seen a few days ago, was sent home on Tessalon  pearls and PCN.

## 2024-08-26 NOTE — ED Notes (Signed)
 Patient seen during triage. BBS clear throughout. UA congestion noted. VSS

## 2024-08-26 NOTE — Discharge Instructions (Signed)
 You were seen today for sore throat, ongoing cough, nasal congestion.  This is likely viral in nature given your recent negative testing.  Add Flonase  and nasal saline to clean out your nasal congestion and help your middle ear strain.  You may use the inhaler as needed for cough.  You were given a dose of Decadron  for tonsillar swelling.  Make sure that you are staying hydrated.  Use a humidifier in your room.

## 2024-09-10 ENCOUNTER — Other Ambulatory Visit: Payer: Self-pay

## 2024-09-10 ENCOUNTER — Emergency Department (HOSPITAL_COMMUNITY)
Admission: EM | Admit: 2024-09-10 | Discharge: 2024-09-11 | Disposition: A | Payer: MEDICAID | Attending: Emergency Medicine | Admitting: Emergency Medicine

## 2024-09-10 ENCOUNTER — Encounter (HOSPITAL_COMMUNITY): Payer: Self-pay

## 2024-09-10 DIAGNOSIS — D72829 Elevated white blood cell count, unspecified: Secondary | ICD-10-CM | POA: Insufficient documentation

## 2024-09-10 DIAGNOSIS — R569 Unspecified convulsions: Secondary | ICD-10-CM | POA: Insufficient documentation

## 2024-09-10 LAB — COMPREHENSIVE METABOLIC PANEL WITH GFR
ALT: 15 U/L (ref 0–44)
AST: 17 U/L (ref 15–41)
Albumin: 3.5 g/dL (ref 3.5–5.0)
Alkaline Phosphatase: 75 U/L (ref 38–126)
Anion gap: 10 (ref 5–15)
BUN: 10 mg/dL (ref 6–20)
CO2: 23 mmol/L (ref 22–32)
Calcium: 8.7 mg/dL — ABNORMAL LOW (ref 8.9–10.3)
Chloride: 105 mmol/L (ref 98–111)
Creatinine, Ser: 0.83 mg/dL (ref 0.44–1.00)
GFR, Estimated: >60 mL/min (ref >60)
Glucose, Bld: 101 mg/dL — ABNORMAL HIGH (ref 70–99)
Potassium: 3.9 mmol/L (ref 3.5–5.1)
Sodium: 138 mmol/L (ref 135–145)
Total Bilirubin: 0.5 mg/dL (ref 0.0–1.2)
Total Protein: 7.6 g/dL (ref 6.5–8.1)

## 2024-09-10 LAB — CBC
HCT: 35.9 % — ABNORMAL LOW (ref 36.0–46.0)
Hemoglobin: 11.3 g/dL — ABNORMAL LOW (ref 12.0–15.0)
MCH: 27 pg (ref 26.0–34.0)
MCHC: 31.5 g/dL (ref 30.0–36.0)
MCV: 85.9 fL (ref 80.0–100.0)
Platelets: 234 K/uL (ref 150–400)
RBC: 4.18 MIL/uL (ref 3.87–5.11)
RDW: 14.5 % (ref 11.5–15.5)
WBC: 10.8 K/uL — ABNORMAL HIGH (ref 4.0–10.5)
nRBC: 0 % (ref 0.0–0.2)

## 2024-09-10 LAB — HCG, SERUM, QUALITATIVE: Preg, Serum: NEGATIVE

## 2024-09-10 LAB — CBG MONITORING, ED: Glucose-Capillary: 104 mg/dL — ABNORMAL HIGH (ref 70–99)

## 2024-09-10 MED ORDER — SODIUM CHLORIDE 0.9 % IV BOLUS
500.0000 mL | Freq: Once | INTRAVENOUS | Status: AC
  Administered 2024-09-11: 500 mL via INTRAVENOUS

## 2024-09-10 NOTE — ED Triage Notes (Signed)
 Pt arrived from home via POV c/o aura seizures Pts mom states that she had multiple before coming to the ED tonight. Pt is A and O x 4 at present.

## 2024-09-10 NOTE — ED Provider Notes (Signed)
 [name] EMERGENCY DEPARTMENT AT [facility] Provider Note   CSN: [id number] Arrival date & time: 09/10/24  2120     Patient presents with: No chief complaint on file.   Kathryn Peters is a 28 y.o. female with history of DiGeorge syndrome, thrombocytopenia, seizures, vitiligo.  Presents to ED complaining of seizure-like activity.  Presents with mother.  Per patient, had around 15 episodes of seizure-like activity tonight noticed by her mother.  The patient reports that she was conscious for all of this.  States that they came home this evening from an appointment around 6, patient reports that she had a aura that concerned her that she was going to have a seizure.  Reports that she has a history of seizures, takes lamotrigine  300 mg ER and is compliant.  Follows with [facility] neurology.  Reports that she laid down because of her aura which she describes as shocks throughout her entire body which she reports she has experienced in the past.  States that she had about 15 episodes of rhythmic jerking of her right and left hand which are typical for seizures.  She states that she had about 15 his episodes which lasted about 1 minute each time.  Denies tongue biting, bowel or bladder incontinence.  Reports that she feels back to baseline at this time.  Denies any recent head trauma.  Reports compliance on lamotrigine .  Denies any nausea, vomiting, chest pain, shortness of breath, dysuria, fevers at home.  HPI     Prior to Admission medications   Medication Sig Start Date End Date Taking? Authorizing Provider  calcium  carbonate (TUMS EX) 750 MG chewable tablet Chew 750 mg by mouth with breakfast, with lunch, and with evening meal.    [provider]  ibuprofen  (ADVIL ) 200 MG tablet Take 400 mg by mouth every 6 (six) hours as needed for headache, moderate pain (pain score 4-6) or fever.    [provider]  LamoTRIgine  300 MG TB24 24 hour tablet Take 1 tablet  every evening 06/29/24   Georjean Darice HERO, MD    Allergies: Tully [prunus persica], Pineapple, and Strawberry (diagnostic)    Review of Systems  Updated Vital Signs BP (!) 137/92 (BP Location: Left Arm)   Pulse (!) 115   Temp 97.7 F (36.5 C)   Resp (!) 21   Ht 5' 4.5 (1.638 m)   Wt 91.2 kg   LMP 08/12/2024 (Approximate)   SpO2 97%   BMI 33.97 kg/m   Physical Exam  (all labs ordered are listed, but only abnormal results are displayed) Labs Reviewed  COMPREHENSIVE METABOLIC PANEL WITH GFR - Abnormal; Notable for the following components:      Result Value   Glucose, Bld 101 (*)    Calcium  8.7 (*)    All other components within normal limits  CBC - Abnormal; Notable for the following components:   WBC 10.8 (*)    Hemoglobin 11.3 (*)    HCT 35.9 (*)    All other components within normal limits  CBG MONITORING, ED - Abnormal; Notable for the following components:   Glucose-Capillary 104 (*)    All other components within normal limits  HCG, SERUM, QUALITATIVE  LAMOTRIGINE  LEVEL    EKG: None  Radiology: No results found.  {Document cardiac monitor, telemetry assessment procedure when appropriate:32947} Procedures   Medications Ordered in the ED  sodium chloride  0.9 % bolus 500 mL (has no administration in time range)      {Click  here for ABCD2, HEART and other calculators REFRESH Note before signing:1}                              Medical Decision Making Amount and/or Complexity of Data Reviewed Labs: ordered.   ***  {Document critical care time when appropriate  Document review of labs and clinical decision tools ie CHADS2VASC2, etc  Document your independent review of radiology images and any outside records  Document your discussion with family members, caretakers and with consultants  Document social determinants of health affecting pt's care  Document your decision making why or why not admission, treatments were needed:32947:::1}   Final  diagnoses:  None    ED Discharge Orders     None

## 2024-09-10 NOTE — ED Triage Notes (Signed)
 Patient bib mother for seizures. She started having seizures around 8:30 tonight and has about 15 since then. Mother states that her hand shakes whenever she is having a seizure. Patient is alert and orientedx4 on arrival to the ED

## 2024-09-11 ENCOUNTER — Telehealth: Payer: Self-pay | Admitting: Neurology

## 2024-09-11 LAB — URINALYSIS, ROUTINE W REFLEX MICROSCOPIC
Bilirubin Urine: NEGATIVE
Glucose, UA: NEGATIVE mg/dL
Hgb urine dipstick: NEGATIVE
Ketones, ur: NEGATIVE mg/dL
Leukocytes,Ua: NEGATIVE
Nitrite: NEGATIVE
Protein, ur: NEGATIVE mg/dL
Specific Gravity, Urine: 1.026 (ref 1.005–1.030)
pH: 6 (ref 5.0–8.0)

## 2024-09-11 NOTE — Telephone Encounter (Signed)
 Left  a message with the after hour service on  09-10-24 at 9:39 pm  Caller state that patient is having a seizure now  Caller states that pt has been having mini 15 seizures in the last hour. Last seizures was mid May on Mothers day. She is taking Lamotrigine  also PCN for bad tooth. Has appt with dentist tomorrow for extraction.    Gave the message to the clinical staff

## 2024-09-11 NOTE — ED Notes (Signed)
 Contacted lab re: urinalysis. Lab still needing to run UA. PA aware

## 2024-09-11 NOTE — Telephone Encounter (Signed)
 Pt c/o: seizure Missed medications?  No. Sleep deprived?  Yes.  Trouble because of a cold been sick about 2 weeks Alcohol intake?  No. Increased stress? Yes.   Holidays family came in asking questions  Any change in medication color or shape? No. Back to their usual baseline self?  Yes.  . If no, advise go to ER Current medications prescribed by Dr. Georjean: LamoTRIgine  300 MG TB24 24 hour tablet Take 1 tablet every evening   Pt was DC from the ER around 4am waiting on lab work to come back for lamotrigine  levels she is at home sleeping at this time, she is scheduled to have a tooth pulled today ,

## 2024-09-11 NOTE — Discharge Instructions (Signed)
 As we discussed, your workup here this evening was reassuring.  Please follow-up with Dr. Georjean, neurology, at your earliest convenience.  Please continue taking lamotrigine  as prescribed.  Return to the ED with any new symptoms.

## 2024-09-12 LAB — LAMOTRIGINE LEVEL: Lamotrigine Lvl: 9.2 ug/mL (ref 2.0–20.0)

## 2024-10-09 ENCOUNTER — Emergency Department (HOSPITAL_BASED_OUTPATIENT_CLINIC_OR_DEPARTMENT_OTHER)
Admission: EM | Admit: 2024-10-09 | Discharge: 2024-10-09 | Disposition: A | Discharge status: Home / Self Care | Payer: MEDICAID

## 2024-10-09 ENCOUNTER — Emergency Department (HOSPITAL_BASED_OUTPATIENT_CLINIC_OR_DEPARTMENT_OTHER): Payer: MEDICAID | Admitting: Radiology

## 2024-10-09 ENCOUNTER — Other Ambulatory Visit: Payer: Self-pay

## 2024-10-09 DIAGNOSIS — J45901 Unspecified asthma with (acute) exacerbation: Secondary | ICD-10-CM | POA: Diagnosis not present

## 2024-10-09 DIAGNOSIS — J40 Bronchitis, not specified as acute or chronic: Secondary | ICD-10-CM

## 2024-10-09 DIAGNOSIS — R059 Cough, unspecified: Secondary | ICD-10-CM | POA: Diagnosis present

## 2024-10-09 MED ORDER — PREDNISONE 10 MG PO TABS: 40.0000 mg | ORAL_TABLET | Freq: Every day | ORAL | 0 refills | Status: AC

## 2024-10-09 MED ORDER — METHYLPREDNISOLONE SODIUM SUCC 125 MG IJ SOLR
125.0000 mg | Freq: Once | INTRAMUSCULAR | Status: AC
  Administered 2024-10-09: 125 mg via INTRAMUSCULAR
  Filled 2024-10-09: qty 2

## 2024-10-09 MED ORDER — PROMETHAZINE-DM 6.25-15 MG/5ML PO SYRP: 5.0000 mL | ORAL_SOLUTION | Freq: Four times a day (QID) | ORAL | 0 refills | Status: AC | PRN

## 2024-10-09 NOTE — ED Triage Notes (Signed)
 Patient states diagnosed with the flu on Sunday. Noticed she has been coughing up specks of blood since yesterday.

## 2024-10-09 NOTE — ED Provider Notes (Signed)
 " [place] EMERGENCY DEPARTMENT AT [facility] Provider Note   CSN: [id number] Arrival date & time: 10/09/24  1358     Patient presents with: Influenza   Kathryn Peters is a 28 y.o. female.   28 year old female presents for evaluation of coughing up blood.  States she was recently diagnosed with flu and has a history of asthma.  States has been using her inhaler but has run out of cough medicine.  States there is blood flecked in her sputum.  Denies any shortness of breath or any other symptoms or concerns at this time.   Influenza Presenting symptoms: cough   Presenting symptoms: no fever, no shortness of breath, no sore throat and no vomiting   Associated symptoms: no chills and no ear pain        Prior to Admission medications  Medication Sig Start Date End Date Taking? Authorizing Provider  predniSONE  (DELTASONE ) 10 MG tablet Take 4 tablets (40 mg total) by mouth daily for 4 days. 10/09/24 10/13/24 Yes Aidan Caloca L, DO  promethazine-dextromethorphan (PROMETHAZINE-DM) 6.25-15 MG/5ML syrup Take 5 mLs by mouth 4 (four) times daily as needed for cough. 10/09/24  Yes Takoda Siedlecki L, DO  calcium  carbonate (TUMS EX) 750 MG chewable tablet Chew 750 mg by mouth with breakfast, with lunch, and with evening meal.    [provider]  Cholecalciferol (VITAMIN D3) 1.25 MG (50000 UT) CAPS Take 1 capsule by mouth once a week.    [provider]  ibuprofen  (ADVIL ) 200 MG tablet Take 400 mg by mouth every 6 (six) hours as needed for headache, moderate pain (pain score 4-6) or fever.    [provider]  LamoTRIgine  300 MG TB24 24 hour tablet Take 1 tablet every evening 06/29/24   Georjean Darice HERO, MD    Allergies: Peach [prunus persica], Pineapple, and Strawberry (diagnostic)    Review of Systems  Constitutional:  Negative for chills and fever.  HENT:  Negative for ear pain and sore throat.   Eyes:  Negative for pain and visual disturbance.   Respiratory:  Positive for cough. Negative for shortness of breath.   Cardiovascular:  Negative for chest pain and palpitations.  Gastrointestinal:  Negative for abdominal pain and vomiting.  Genitourinary:  Negative for dysuria and hematuria.  Musculoskeletal:  Negative for arthralgias and back pain.  Skin:  Negative for color change and rash.  Neurological:  Negative for seizures and syncope.  All other systems reviewed and are negative.   Updated Vital Signs BP 114/87 (BP Location: Right Arm)   Pulse (!) 106   Temp 98.1 F (36.7 C) (Oral)   Resp 17   LMP 08/12/2024 (Approximate)   SpO2 94%   Physical Exam Vitals and nursing note reviewed.  Constitutional:      General: She is not in acute distress.    Appearance: Normal appearance. She is well-developed. She is not ill-appearing.  HENT:     Head: Normocephalic and atraumatic.  Eyes:     Conjunctiva/sclera: Conjunctivae normal.  Cardiovascular:     Rate and Rhythm: Regular rhythm. Tachycardia present.     Heart sounds: No murmur heard. Pulmonary:     Effort: Pulmonary effort is normal. No respiratory distress.     Breath sounds: Wheezing present.  Abdominal:     Palpations: Abdomen is soft.     Tenderness: There is no abdominal tenderness.  Musculoskeletal:        General: No swelling.     Cervical back:  Neck supple.  Skin:    General: Skin is warm and dry.     Capillary Refill: Capillary refill takes less than 2 seconds.  Neurological:     Mental Status: She is alert.  Psychiatric:        Mood and Affect: Mood normal.     (all labs ordered are listed, but only abnormal results are displayed) Labs Reviewed - No data to display  EKG: None  Radiology: DG Chest 2 View Result Date: 10/09/2024 EXAM: 2 VIEW(S) XRAY OF THE CHEST 10/09/2024 02:58:22 PM COMPARISON: 01/18/2021 CLINICAL HISTORY: cough FINDINGS: LUNGS AND PLEURA: No focal pulmonary opacity. No pleural effusion. No pneumothorax. HEART AND  MEDIASTINUM: No acute abnormality of the cardiac and mediastinal silhouettes. BONES AND SOFT TISSUES: No acute osseous abnormality. IMPRESSION: 1. No active cardiopulmonary disease. Electronically signed by: Franky Crease MD 10/09/2024 03:41 PM EST RP Workstation: [id number]     Procedures   Medications Ordered in the ED  methylPREDNISolone  sodium succinate (SOLU-MEDROL ) 125 mg/2 mL injection 125 mg (125 mg Intramuscular Given 10/09/24 1726)                                    Medical Decision Making Patient with wheezing on exam.  Negative x-ray for pneumonia.  Likely has bronchitis and exacerbation of her asthma from flu.  Will give her Solu-Medrol  here.  Advised to continue her albuterol  I will give prescription for steroids and promethazine cough medicine.  Advised other over-the-counter medications as needed for her symptoms and close follow-up with primary care or return to the ER for new or worsening symptoms.  All results and plan discussed with patient and mom at bedside and they feel comfortable with plan for discharge.  Problems Addressed: Bronchitis: acute illness or injury Moderate asthma with exacerbation, unspecified whether persistent: acute illness or injury  Amount and/or Complexity of Data Reviewed External Data Reviewed: notes.    Details: Prior ED records reviewed from 09-10-2024 and patient was seen for seizure-like activity Radiology: ordered and independent interpretation performed. Decision-making details documented in ED Course.    Details: Ordered and interpreted by me independently of radiology Chest x-ray: Shows no acute abnormality  Risk OTC drugs. Prescription drug management.     Final diagnoses:  Moderate asthma with exacerbation, unspecified whether persistent  Bronchitis    ED Discharge Orders          Ordered    predniSONE  (DELTASONE ) 10 MG tablet  Daily        10/09/24 1721    promethazine-dextromethorphan (PROMETHAZINE-DM) 6.25-15 MG/5ML  syrup  4 times daily PRN        10/09/24 1721               Devon Pretty L, DO 10/09/24 1740  "

## 2024-10-09 NOTE — Discharge Instructions (Signed)
 Continue your inhalers as needed and take your steroids as prescribed.  You can take your cough medication as needed.  Is okay for you to take anything else over-the-counter as needed for your symptoms.

## 2024-12-28 ENCOUNTER — Ambulatory Visit: Payer: MEDICAID | Admitting: Neurology
